# Patient Record
Sex: Female | Born: 1975
Health system: Southern US, Community
[De-identification: ages and names within clinical notes are randomized; demographics above are authoritative.]

## PROBLEM LIST (undated history)

## (undated) DIAGNOSIS — N979 Female infertility, unspecified: Secondary | ICD-10-CM

## (undated) DIAGNOSIS — F32A Depression, unspecified: Secondary | ICD-10-CM

## (undated) DIAGNOSIS — F419 Anxiety disorder, unspecified: Secondary | ICD-10-CM

## (undated) DIAGNOSIS — R32 Unspecified urinary incontinence: Secondary | ICD-10-CM

## (undated) DIAGNOSIS — E282 Polycystic ovarian syndrome: Secondary | ICD-10-CM

## (undated) DIAGNOSIS — G43009 Migraine without aura, not intractable, without status migrainosus: Secondary | ICD-10-CM

## (undated) DIAGNOSIS — Z8489 Family history of other specified conditions: Secondary | ICD-10-CM

## (undated) DIAGNOSIS — N809 Endometriosis, unspecified: Secondary | ICD-10-CM

## (undated) DIAGNOSIS — F329 Major depressive disorder, single episode, unspecified: Secondary | ICD-10-CM

## (undated) HISTORY — DX: Female infertility, unspecified: N97.9

## (undated) HISTORY — PX: LAPAROSCOPIC ENDOMETRIOSIS FULGURATION: SUR769

## (undated) HISTORY — DX: Unspecified urinary incontinence: R32

## (undated) HISTORY — PX: BREAST SURGERY: SHX581

## (undated) HISTORY — PX: HYSTEROSCOPY: SHX211

## (undated) HISTORY — DX: Endometriosis, unspecified: N80.9

## (undated) HISTORY — PX: PELVIC LAPAROSCOPY: SHX162

## (undated) HISTORY — PX: OTHER SURGICAL HISTORY: SHX169

## (undated) HISTORY — PX: CRYOTHERAPY: SHX1416

## (undated) HISTORY — DX: Polycystic ovarian syndrome: E28.2

## (undated) HISTORY — DX: Migraine without aura, not intractable, without status migrainosus: G43.009

---

## 1999-08-21 ENCOUNTER — Encounter (INDEPENDENT_AMBULATORY_CARE_PROVIDER_SITE_OTHER): Payer: Self-pay | Admitting: *Deleted

## 1999-08-21 ENCOUNTER — Ambulatory Visit (HOSPITAL_BASED_OUTPATIENT_CLINIC_OR_DEPARTMENT_OTHER): Admission: RE | Admit: 1999-08-21 | Discharge: 1999-08-21 | Payer: Self-pay | Admitting: Orthopedic Surgery

## 2000-08-10 ENCOUNTER — Other Ambulatory Visit: Admission: RE | Admit: 2000-08-10 | Discharge: 2000-08-10 | Payer: Self-pay | Admitting: Obstetrics and Gynecology

## 2001-10-05 ENCOUNTER — Other Ambulatory Visit: Admission: RE | Admit: 2001-10-05 | Discharge: 2001-10-05 | Payer: Self-pay | Admitting: Obstetrics and Gynecology

## 2001-10-18 ENCOUNTER — Ambulatory Visit (HOSPITAL_COMMUNITY): Admission: RE | Admit: 2001-10-18 | Discharge: 2001-10-18 | Payer: Self-pay | Admitting: Obstetrics and Gynecology

## 2001-10-18 ENCOUNTER — Encounter: Payer: Self-pay | Admitting: Obstetrics and Gynecology

## 2002-01-18 ENCOUNTER — Ambulatory Visit (HOSPITAL_COMMUNITY): Admission: RE | Admit: 2002-01-18 | Discharge: 2002-01-18 | Payer: Self-pay | Admitting: Obstetrics and Gynecology

## 2002-02-07 ENCOUNTER — Encounter: Admission: RE | Admit: 2002-02-07 | Discharge: 2002-05-08 | Payer: Self-pay | Admitting: Obstetrics and Gynecology

## 2003-03-19 ENCOUNTER — Inpatient Hospital Stay (HOSPITAL_COMMUNITY): Admission: AD | Admit: 2003-03-19 | Discharge: 2003-03-19 | Payer: Self-pay | Admitting: Obstetrics and Gynecology

## 2003-03-19 ENCOUNTER — Other Ambulatory Visit: Admission: RE | Admit: 2003-03-19 | Discharge: 2003-03-19 | Payer: Self-pay | Admitting: Obstetrics and Gynecology

## 2004-06-03 ENCOUNTER — Other Ambulatory Visit: Admission: RE | Admit: 2004-06-03 | Discharge: 2004-06-03 | Payer: Self-pay | Admitting: Obstetrics and Gynecology

## 2005-06-15 ENCOUNTER — Other Ambulatory Visit: Admission: RE | Admit: 2005-06-15 | Discharge: 2005-06-15 | Payer: Self-pay | Admitting: Obstetrics and Gynecology

## 2006-04-05 ENCOUNTER — Ambulatory Visit: Payer: Self-pay | Admitting: Internal Medicine

## 2006-06-21 ENCOUNTER — Other Ambulatory Visit: Admission: RE | Admit: 2006-06-21 | Discharge: 2006-06-21 | Payer: Self-pay | Admitting: Obstetrics and Gynecology

## 2007-06-28 ENCOUNTER — Inpatient Hospital Stay (HOSPITAL_COMMUNITY): Admission: AD | Admit: 2007-06-28 | Discharge: 2007-06-30 | Payer: Self-pay | Admitting: Obstetrics and Gynecology

## 2007-07-03 ENCOUNTER — Encounter: Admission: RE | Admit: 2007-07-03 | Discharge: 2007-08-01 | Payer: Self-pay | Admitting: Obstetrics and Gynecology

## 2007-08-02 ENCOUNTER — Encounter: Admission: RE | Admit: 2007-08-02 | Discharge: 2007-09-01 | Payer: Self-pay | Admitting: Obstetrics and Gynecology

## 2007-09-02 ENCOUNTER — Encounter: Admission: RE | Admit: 2007-09-02 | Discharge: 2007-09-24 | Payer: Self-pay | Admitting: Obstetrics and Gynecology

## 2008-11-26 ENCOUNTER — Ambulatory Visit: Payer: Self-pay | Admitting: Obstetrics and Gynecology

## 2008-11-26 ENCOUNTER — Encounter: Payer: Self-pay | Admitting: Obstetrics and Gynecology

## 2008-11-26 ENCOUNTER — Other Ambulatory Visit: Admission: RE | Admit: 2008-11-26 | Discharge: 2008-11-26 | Payer: Self-pay | Admitting: Obstetrics and Gynecology

## 2008-12-02 ENCOUNTER — Ambulatory Visit: Payer: Self-pay | Admitting: Obstetrics and Gynecology

## 2009-12-10 ENCOUNTER — Ambulatory Visit: Payer: Self-pay | Admitting: Obstetrics and Gynecology

## 2009-12-10 ENCOUNTER — Other Ambulatory Visit: Admission: RE | Admit: 2009-12-10 | Discharge: 2009-12-10 | Payer: Self-pay | Admitting: Obstetrics and Gynecology

## 2009-12-24 ENCOUNTER — Ambulatory Visit: Payer: Self-pay | Admitting: Obstetrics and Gynecology

## 2010-05-06 ENCOUNTER — Ambulatory Visit: Payer: Self-pay | Admitting: Obstetrics and Gynecology

## 2010-05-08 ENCOUNTER — Ambulatory Visit: Payer: Self-pay | Admitting: Obstetrics and Gynecology

## 2010-10-11 NOTE — L&D Delivery Note (Signed)
Delivery Note At 7:55 PM a viable female was delivered via Vaginal, Spontaneous Delivery (Presentation: ; Occiput Anterior).  APGAR: 9, 9; weight 7 lb 8.6 oz (3419 g).   Placenta status: Intact, Spontaneous.  Cord: 3 vessels with no complications. Cord pH: not needed.   Anesthesia: Pudendal block Episiotomy: None Lacerations: Vaginal; Perineal;1st degree and right labial. Suture Repair: 3.0 vicryl rapide Est. Blood Loss (mL):  250 cc.  Mom to postpartum.  Baby to with mother.Marland Kitchen  Virdia Ziesmer R 08/13/2011, 9:18 PM

## 2010-12-09 ENCOUNTER — Ambulatory Visit (INDEPENDENT_AMBULATORY_CARE_PROVIDER_SITE_OTHER): Payer: BC Managed Care – PPO | Admitting: Obstetrics and Gynecology

## 2010-12-09 DIAGNOSIS — M545 Low back pain, unspecified: Secondary | ICD-10-CM

## 2010-12-09 DIAGNOSIS — N912 Amenorrhea, unspecified: Secondary | ICD-10-CM

## 2010-12-11 ENCOUNTER — Other Ambulatory Visit (INDEPENDENT_AMBULATORY_CARE_PROVIDER_SITE_OTHER): Payer: BC Managed Care – PPO

## 2010-12-11 ENCOUNTER — Other Ambulatory Visit: Payer: BC Managed Care – PPO

## 2010-12-11 DIAGNOSIS — N912 Amenorrhea, unspecified: Secondary | ICD-10-CM

## 2010-12-15 ENCOUNTER — Other Ambulatory Visit (INDEPENDENT_AMBULATORY_CARE_PROVIDER_SITE_OTHER): Payer: BC Managed Care – PPO

## 2010-12-15 DIAGNOSIS — N912 Amenorrhea, unspecified: Secondary | ICD-10-CM

## 2010-12-17 ENCOUNTER — Other Ambulatory Visit (HOSPITAL_COMMUNITY)
Admission: RE | Admit: 2010-12-17 | Discharge: 2010-12-17 | Disposition: A | Discharge status: Home / Self Care | Payer: BC Managed Care – PPO | Source: Ambulatory Visit | Attending: Obstetrics and Gynecology | Admitting: Obstetrics and Gynecology

## 2010-12-17 ENCOUNTER — Other Ambulatory Visit: Payer: Self-pay | Admitting: Obstetrics and Gynecology

## 2010-12-17 ENCOUNTER — Encounter (INDEPENDENT_AMBULATORY_CARE_PROVIDER_SITE_OTHER): Payer: BC Managed Care – PPO | Admitting: Obstetrics and Gynecology

## 2010-12-17 DIAGNOSIS — Z01419 Encounter for gynecological examination (general) (routine) without abnormal findings: Secondary | ICD-10-CM

## 2010-12-17 DIAGNOSIS — Z124 Encounter for screening for malignant neoplasm of cervix: Secondary | ICD-10-CM | POA: Insufficient documentation

## 2010-12-29 ENCOUNTER — Ambulatory Visit (INDEPENDENT_AMBULATORY_CARE_PROVIDER_SITE_OTHER): Payer: BC Managed Care – PPO | Admitting: Obstetrics and Gynecology

## 2010-12-29 ENCOUNTER — Other Ambulatory Visit: Payer: BC Managed Care – PPO

## 2010-12-29 DIAGNOSIS — N921 Excessive and frequent menstruation with irregular cycle: Secondary | ICD-10-CM

## 2010-12-29 DIAGNOSIS — O9989 Other specified diseases and conditions complicating pregnancy, childbirth and the puerperium: Secondary | ICD-10-CM

## 2010-12-30 LAB — ABO/RH: RH Type: POSITIVE

## 2010-12-30 LAB — ANTIBODY SCREEN: Antibody Screen: NEGATIVE

## 2011-01-07 LAB — GC/CHLAMYDIA PROBE AMP, GENITAL: Gonorrhea: NEGATIVE

## 2011-02-23 NOTE — H&P (Signed)
Erika Farley, Erika Farley             ACCOUNT NO.:  [id number]   MEDICAL RECORD NO.:  [id number]          PATIENT TYPE:  INP   LOCATION:  9166                          FACILITY:  [facility]   PHYSICIAN:  Lenoard Aden, M.D.DATE OF BIRTH:  [birth year]   DATE OF ADMISSION:  06/28/2007  DATE OF DISCHARGE:                              HISTORY & PHYSICAL   INDICATION FOR INDUCTION:  Intractable/worsening depressive anxiety  symptomatology for controlled attempt at vaginal delivery and  optimization of depressive symptomatology.   HISTORY OF PRESENT ILLNESS:  She is a 35 year old white female G1, P0  who presents for induction to promote, at 39 weeks, for cervical  ripening and induction for worsening depressive anxiety symptoms and the  hope of delivery with subsequent better management of her anxiety and  depressive symptomatology.   MEDICATIONS:  Prenatal vitamins,  BuSpar and Zoloft.   PAST MEDICAL HISTORY:  1. Bone graft surgery.  2. Anemia.  3. UTI.  4. Pyelonephritis.  5. PCO.  6. Depression and anxiety.   SOCIAL HISTORY:  She is a nonsmoker, nondrinker, denies domestic or  physical violence.   PREGNANCY COURSE TO DATE:  Uncomplicated.   PHYSICAL EXAMINATION:  GENERAL APPEARANCE:  She is an anxious-appearing  white female in no acute distress.  HEENT:  Normal.  LUNGS:  Clear.  CARDIOVASCULAR:  Regular rate and rhythm.  ABDOMEN:  Soft, gravid, nontender.  Estimated fetal weight 7-1/2 to 8  pounds.  PELVIC:  Cervix is closed, 50%, vertex -1.  EXTREMITIES:  No cords.  NEUROLOGIC:  Nonfocal.  SKIN:  Intact.   NST is reactive.  Cervidil is placed.   IMPRESSION:  1. Term intrauterine pregnancy at 39 weeks for cervical ripening and      induction.  2. Worsening depressive anxiety symptoms for optimization of therapy      postpartum.   PLAN:  Proceed with cervical ripening and induction.      Lenoard Aden, M.D.  Electronically Signed     RJT/MEDQ  D:   06/28/2007  T:  06/28/2007  Job:  [id number]

## 2011-02-26 NOTE — Op Note (Signed)
[facility]  Patient:    Erika Farley, Erika Farley Visit Number: [id number] MRN: [id number]          Service Type: DSU Location: [facility] Attending Physician:  Sharon Mt Dictated by:   Rande Brunt. Eda Paschal, M.D. Proc. Date: 01/18/02 Admit Date:  01/18/2002                             Operative Report  PREOPERATIVE DIAGNOSES:       Probable symptomatic endometriosis.  POSTOPERATIVE DIAGNOSES:      Symptomatic endometriosis, polycystic ovarian disease.  NAME OF OPERATION:            Diagnostic laparoscopy with laser of both paraovarian cysts and endometriosis.  SURGEON:                      Daniel L. Eda Paschal, M.D.  ANESTHESIA:                   General endotracheal.  INDICATIONS:                  Patient is a 35 year old nulligravida who has been having progressively increasing dysmenorrhea with back discomfort which starts four to five days before her period.  She is not getting relief from nonsteroidal anti-inflammatory drugs.  She has been trying to conceive and therefore does not want to go on birth control pills.  She is having significant dyspareunia as well.  On pelvic examination she has a retroverted uterus with very tender uterosacral ligaments.  She enters the hospital for laparoscopy with endometriosis suspected.  FINDINGS:                     At the time of laparoscopy patient had significant cul-de-sac endometriosis mostly involving the serosa of the uterus posteriorly.  It was mostly nonpigmented.  She had several areas that were pigmented.  She also had some red areas of endometriosis.  She had a red area of endometriosis under her right fallopian tube.  She also had some red areas of endometriosis on the right side behind the ovary.  Patient had an endometrial implant on the right ovary about 2-3 mm that was pigmented.  She had some surface adhesions on the left ovary that appeared to be consistent with endometriosis.  Both  ovaries were polycystic.  Fallopian tubes were normal with luxuriant fimbria.  There was no adhesive disease.  Uterus itself was normal except as described above.  Ileocecal junction was identified. Appendix was normal as was the right upper quadrant.  PROCEDURE:                    After adequate general endotracheal anesthesia the patient was placed in the dorsal lithotomy position, prepped and draped in the usual sterile manner.  A Jelco cannula was inserted into the uterus. The bladder was emptied with a Robinson catheter.  A pneumoperitoneum was created with a Veress needle placed subumbilically with carbon dioxide. Carbon dioxide 3.5 L was utilized.  The subumbilical incision was then  extended.  A 10 mm trocar was placed and through that the operating laparoscope was placed.  A 5 mm port was placed suprapubically and through that an irrigator aspirator as well as bipolar coagulator were placed at different times during the procedure.  The pelvis was visualized and was noted above.  A neodymium YAG laser was utilized with a GR-4  tip over the bare fiber.  12 watts power was used throughout.  All areas of endometriosis were laser vaporized.  There was one area that bled significantly and bipolar was utilized to stop it.  Multiple cystic areas on both ovaries were lasered and drained to fluid, mostly to be sure that we were not dealing with endometriomas.  At the termination of the procedure there was no bleeding noted.  The trocars were removed.  The pneumoperitoneum was evacuated.  The subumbilical incision was closed with 0 Vicryl and the skin incisions were closed with Steri-Strips.  The patients cervix tore from the tenaculum and it was reapproximated with a figure-of-eight of 0 Vicryl.  The patient tolerated procedure well and left the operating room in satisfactory condition. Dictated by:   Rande Brunt. Eda Paschal, M.D. Attending Physician:  Sharon Mt DD:  01/18/02 TD:   01/18/02 Job: [id number]

## 2011-07-22 LAB — CBC
HCT: 30 — ABNORMAL LOW
HCT: 37.6
Hemoglobin: 10.4 — ABNORMAL LOW
Hemoglobin: 13
MCHC: 34.5
MCHC: 34.8
MCV: 83.9
MCV: 84.4
Platelets: 193
Platelets: 218
RBC: 3.55 — ABNORMAL LOW
RBC: 4.48
RDW: 13.7
RDW: 13.7
WBC: 11.5 — ABNORMAL HIGH
WBC: 18.7 — ABNORMAL HIGH

## 2011-07-22 LAB — RPR: RPR Ser Ql: NONREACTIVE

## 2011-08-13 ENCOUNTER — Encounter (HOSPITAL_COMMUNITY): Payer: Self-pay | Admitting: *Deleted

## 2011-08-13 ENCOUNTER — Inpatient Hospital Stay (HOSPITAL_COMMUNITY)
Admission: AD | Admit: 2011-08-13 | Discharge: 2011-08-14 | DRG: 373 | Disposition: A | Discharge status: Home / Self Care | Payer: BC Managed Care – PPO | Source: Ambulatory Visit | Attending: Obstetrics & Gynecology | Admitting: Obstetrics & Gynecology

## 2011-08-13 DIAGNOSIS — O09529 Supervision of elderly multigravida, unspecified trimester: Secondary | ICD-10-CM | POA: Diagnosis present

## 2011-08-13 HISTORY — DX: Depression, unspecified: F32.A

## 2011-08-13 HISTORY — DX: Major depressive disorder, single episode, unspecified: F32.9

## 2011-08-13 LAB — RUBELLA ANTIBODY, IGM: Rubella: IMMUNE

## 2011-08-13 LAB — RPR: RPR: NONREACTIVE

## 2011-08-13 LAB — HIV ANTIBODY (ROUTINE TESTING W REFLEX): HIV: NONREACTIVE

## 2011-08-13 LAB — STREP B DNA PROBE: GBS: NEGATIVE

## 2011-08-13 MED ORDER — ZOLPIDEM TARTRATE 5 MG PO TABS: 5.0000 mg | ORAL_TABLET | Freq: Every evening | ORAL | Status: DC | PRN

## 2011-08-13 MED ORDER — NALOXONE HCL 0.4 MG/ML IJ SOLN
INTRAMUSCULAR | Status: AC
  Filled 2011-08-13: qty 1

## 2011-08-13 MED ORDER — WITCH HAZEL-GLYCERIN EX PADS: 1.0000 "application " | MEDICATED_PAD | CUTANEOUS | Status: DC | PRN

## 2011-08-13 MED ORDER — LACTATED RINGERS IV SOLN: 500.0000 mL | INTRAVENOUS | Status: DC | PRN

## 2011-08-13 MED ORDER — CITRIC ACID-SODIUM CITRATE 334-500 MG/5ML PO SOLN: 30.0000 mL | ORAL | Status: DC | PRN

## 2011-08-13 MED ORDER — LIDOCAINE HCL (PF) 1 % IJ SOLN
30.0000 mL | INTRAMUSCULAR | Status: DC | PRN
  Administered 2011-08-13: 60 mL via SUBCUTANEOUS
  Filled 2011-08-13: qty 60

## 2011-08-13 MED ORDER — DIPHENHYDRAMINE HCL 25 MG PO CAPS: 25.0000 mg | ORAL_CAPSULE | Freq: Four times a day (QID) | ORAL | Status: DC | PRN

## 2011-08-13 MED ORDER — PRENATAL PLUS 27-1 MG PO TABS
1.0000 | ORAL_TABLET | Freq: Every day | ORAL | Status: DC
  Filled 2011-08-13: qty 1

## 2011-08-13 MED ORDER — LANOLIN HYDROUS EX OINT: TOPICAL_OINTMENT | CUTANEOUS | Status: DC | PRN

## 2011-08-13 MED ORDER — SENNOSIDES-DOCUSATE SODIUM 8.6-50 MG PO TABS
2.0000 | ORAL_TABLET | Freq: Every day | ORAL | Status: DC
  Administered 2011-08-13: 2 via ORAL

## 2011-08-13 MED ORDER — IBUPROFEN 600 MG PO TABS
600.0000 mg | ORAL_TABLET | Freq: Four times a day (QID) | ORAL | Status: DC | PRN
  Administered 2011-08-13: 600 mg via ORAL
  Filled 2011-08-13: qty 1

## 2011-08-13 MED ORDER — IBUPROFEN 600 MG PO TABS
600.0000 mg | ORAL_TABLET | Freq: Four times a day (QID) | ORAL | Status: DC
  Administered 2011-08-14 (×2): 600 mg via ORAL
  Filled 2011-08-13 (×2): qty 1

## 2011-08-13 MED ORDER — LACTATED RINGERS IV SOLN: INTRAVENOUS | Status: DC

## 2011-08-13 MED ORDER — ACETAMINOPHEN 325 MG PO TABS: 650.0000 mg | ORAL_TABLET | ORAL | Status: DC | PRN

## 2011-08-13 MED ORDER — TETANUS-DIPHTH-ACELL PERTUSSIS 5-2.5-18.5 LF-MCG/0.5 IM SUSP
0.5000 mL | Freq: Once | INTRAMUSCULAR | Status: AC
  Administered 2011-08-14: 0.5 mL via INTRAMUSCULAR
  Filled 2011-08-13: qty 0.5

## 2011-08-13 MED ORDER — SIMETHICONE 80 MG PO CHEW: 80.0000 mg | CHEWABLE_TABLET | ORAL | Status: DC | PRN

## 2011-08-13 MED ORDER — OXYCODONE-ACETAMINOPHEN 5-325 MG PO TABS: 1.0000 | ORAL_TABLET | ORAL | Status: DC | PRN

## 2011-08-13 MED ORDER — OXYTOCIN 20 UNITS IN LACTATED RINGERS INFUSION - SIMPLE: 125.0000 mL/h | Freq: Once | INTRAVENOUS | Status: DC

## 2011-08-13 MED ORDER — OXYTOCIN 10 UNIT/ML IJ SOLN
INTRAMUSCULAR | Status: AC
  Administered 2011-08-13: 20 [IU] via INTRAMUSCULAR
  Filled 2011-08-13: qty 2

## 2011-08-13 MED ORDER — BENZOCAINE-MENTHOL 20-0.5 % EX AERO
INHALATION_SPRAY | CUTANEOUS | Status: AC
  Administered 2011-08-13: 1 via TOPICAL
  Filled 2011-08-13: qty 56

## 2011-08-13 MED ORDER — FLEET ENEMA 7-19 GM/118ML RE ENEM: 1.0000 | ENEMA | RECTAL | Status: DC | PRN

## 2011-08-13 MED ORDER — DIBUCAINE 1 % RE OINT: 1.0000 "application " | TOPICAL_OINTMENT | RECTAL | Status: DC | PRN

## 2011-08-13 MED ORDER — ONDANSETRON HCL 4 MG/2ML IJ SOLN: 4.0000 mg | Freq: Four times a day (QID) | INTRAMUSCULAR | Status: DC | PRN

## 2011-08-13 MED ORDER — OXYTOCIN BOLUS FROM INFUSION
500.0000 mL | Freq: Once | INTRAVENOUS | Status: DC
  Filled 2011-08-13: qty 500

## 2011-08-13 MED ORDER — BENZOCAINE-MENTHOL 20-0.5 % EX AERO
1.0000 "application " | INHALATION_SPRAY | CUTANEOUS | Status: DC | PRN
  Administered 2011-08-13: 1 via TOPICAL

## 2011-08-13 MED ORDER — OXYCODONE-ACETAMINOPHEN 5-325 MG PO TABS: 2.0000 | ORAL_TABLET | ORAL | Status: DC | PRN

## 2011-08-13 NOTE — H&P (Signed)
Erika Farley is a 35 y.o. female presenting for active labor at 39 wks. Bleeding+, no leaking. Good FMs. Uncomplicated Ob sourse, [facility]. (Dr Algie Coffer)  History OB History    Grav Para Term Preterm Abortions TAB SAB Ect Mult Living   2 1 1  0 0 0 0 0 0 1     Past Medical History  Diagnosis Date  . No pertinent past medical history    Past Surgical History  Procedure Date  . Bone tumor in finger   . Laparoscopic endometriosis fulguration    Family History: family history is not on file. Social History:  reports that she has never smoked. She has never used smokeless tobacco. She reports that she does not drink alcohol or use illicit drugs.  ROS No HAs/CP/vision prb/ all neg.  Physical exam:  A&O x 3, no acute distress. Pleasant, in active labor HEENT neg, no thyromegaly Lungs CTA bilat CV RRR, S1S2 normal Abdo soft, non tender, non acute Extr no edema/ tenderness Pelvic 9/100/0/vtx. SROM clear. Bloody show.   FHT  140-150s  Intermittent only, was category I in triage.  Toco spont, q 2-3 min .Dilation: 7 Effacement (%): 100 Station: -1 Exam by:: Dorrene German RN Blood pressure 135/75, pulse 83, resp. rate 24, height 5\' 3"  (1.6 m), weight 81.647 kg (180 lb). Exam Physical Exam  Prenatal labs: ABO, Rh: O/Positive/-- (03/21 0000) Antibody: Negative (03/21 0000) Rubella:  Immu RPR: Nonreactive (08/13 0000)  HBsAg:   Neg HIV:   Neg GBS: Negative (10/11 0000)  Nl Glucola screen Nl Anatomy sono.   Assessment/Plan: Active labor, 39 wks, no pain mngmt desired. FHT -intermittent.    Kathreen Dileo R 08/13/2011, 7:24 PM

## 2011-08-14 LAB — CBC
MCHC: 33.3 g/dL (ref 30.0–36.0)
Platelets: 175 10*3/uL (ref 150–400)
RDW: 13 % (ref 11.5–15.5)
WBC: 16.8 10*3/uL — ABNORMAL HIGH (ref 4.0–10.5)

## 2011-08-14 MED ORDER — IBUPROFEN 200 MG PO TABS: 200.0000 mg | ORAL_TABLET | ORAL | Status: AC | PRN

## 2011-08-14 MED ORDER — IBUPROFEN 400 MG PO TABS
200.0000 mg | ORAL_TABLET | ORAL | Status: DC | PRN
  Filled 2011-08-14: qty 1

## 2011-08-14 MED ORDER — ACETAMINOPHEN 325 MG PO TABS: 650.0000 mg | ORAL_TABLET | Freq: Four times a day (QID) | ORAL | Status: AC | PRN

## 2011-08-14 MED ORDER — ACETAMINOPHEN 325 MG PO TABS
650.0000 mg | ORAL_TABLET | Freq: Four times a day (QID) | ORAL | Status: DC | PRN
  Administered 2011-08-14: 650 mg via ORAL
  Filled 2011-08-14: qty 2

## 2011-08-14 NOTE — Progress Notes (Signed)
CLINICAL SOCIAL WORK  BRIEF PSYCHOSOCIAL ASSESSMENT  Referred by  Central Nursery on 08/13/11  for Hx of depression  XPatient Interview Erika Farley Interview    PSYCHOSOCIAL DATA:    Family's new baby girl lives with [name], FOB and two brothers.    Primary Support (Name/Relationship) : Erika Farley-husband Degree of support available: Consistent, excellent  CURRENT CONCERNS:      X None noted    SOCIAL WORK ASSESSMENT/PLAN:  LCSW met with [name], FOB and newborn daughter at bedside for assessment of strengths and needs following referral from care provider for history of depression.  [name] reports that she stopped taking depression medications when she found out she was pregnant.  Her behavioral health needs are managed at her primary care doctor's office as needed.  [name] is breastfeeding her newborn. She does not intend to utilize medications during breastfeeding.  She did not use medications when breastfeeding her other children as well.  Mom plans to return to her OB for her postpartum check up.  She understands her OB office can also be a source of support should behavioral health needs arise.  [name] and FOB desire early discharge to enjoy the comforts of home.  [name] does not report current symptoms of depression and did not need additional support or resources at this time.  She reported that she felt fine.  Parents are happy for baby's arrival and look forward to going home and being with family. High level of support available to [name] and family.    X  No Further Intervention Required   PATIENT'S/FAMILY'S RESPONSE TO PLAN OF CARE:  Parents were accepting of LCSW visit.  They are eager to go home, rest, and enjoy time with their other two children and family.  Parents knowledgeable of resources and supports available to manage behavioral health needs should they be indicated.    Erika Acosta, LCSW  08/14/2011  1:51 pm

## 2011-08-14 NOTE — Discharge Summary (Signed)
Obstetric Discharge Summary Reason for Admission: onset of labor Prenatal Procedures: none Intrapartum Procedures: NSVD and pudendal block Postpartum Procedures: none Complications-Operative and Postpartum: 1st  degree perineal laceration Hemoglobin  Date Value Range Status  08/14/2011 12.9  12.0-15.0 (g/dL) Final     HCT  Date Value Range Status  08/14/2011 38.7  36.0-46.0 (%) Final    Discharge Diagnoses: Term Pregnancy-delivered  Discharge Information: Date: 08/14/2011 Activity: pelvic rest Diet: routine Medications: Ibuprofen Condition: stable Instructions: refer to practice specific booklet Discharge to: home   Newborn Data: Live born female  Birth Weight: 7 lb 8.6 oz (3419 g) APGAR: 9, 9  Home with mother.  Juanetta Beets SCNM BAILEY,TANYA 08/14/2011, 11:35 AM  Chart reviewed, desires early discharge since doing well. Agree if baby ok for discharge/

## 2011-08-14 NOTE — Progress Notes (Signed)
  PPD 1 SVD  S:  Reports feeling well, desires d/c this evening             Tolerating po/ No vomiting; slight nausea after taking 600 mg ibuprofen             Bleeding is light             Pain controlled withibuprofen (OTC)             Up ad lib / ambulatory  Newborn breast feeding    O:  A & O x 3, NAD, pleasant affect             VS: Blood pressure 123/75, pulse 77, temperature 98.2 F (36.8 C), temperature source Oral, resp. rate 18, height 5\' 3"  (1.6 m), weight 81.647 kg (180 lb), last menstrual period 11/12/2010, SpO2 98.00%, unknown if currently breastfeeding.  LABS: Lab Results  Component Value Date   WBC 16.8* 08/14/2011   HGB 12.9 08/14/2011   HCT 38.7 08/14/2011   MCV 86.8 08/14/2011   PLT 175 08/14/2011     Lungs: Clear and unlabored  Heart: regular rate and rhythm / no mumurs  Abdomen: soft, non-tender, non-distended              Fundus: firm, non-tender, U-1  Perineum: healing well, no edema, no ecchymosis  Lochia: scant rubra  Extremities: no edema, no calf pain or tenderness, negative Homans    A: PPD # 1  Doing well - stable status  Breastfeeding independently  P: Routine post partum orders  Decrease Ibuprofen to 200mg  PRN  DC home this PM (24hrs) Rosita Fire [facility] 08/14/2011, 10:51 AM

## 2014-08-12 ENCOUNTER — Encounter (HOSPITAL_COMMUNITY): Payer: Self-pay | Admitting: *Deleted

## 2015-03-18 ENCOUNTER — Other Ambulatory Visit: Payer: Self-pay | Admitting: Gynecology

## 2015-03-19 LAB — CYTOLOGY - PAP

## 2015-05-06 ENCOUNTER — Other Ambulatory Visit: Payer: Self-pay | Admitting: Gynecology

## 2015-05-07 LAB — CYTOLOGY - PAP

## 2016-03-15 ENCOUNTER — Other Ambulatory Visit: Payer: Self-pay | Admitting: Physician Assistant

## 2016-03-15 DIAGNOSIS — R109 Unspecified abdominal pain: Secondary | ICD-10-CM

## 2016-03-15 DIAGNOSIS — R319 Hematuria, unspecified: Secondary | ICD-10-CM

## 2016-03-16 ENCOUNTER — Ambulatory Visit
Admission: RE | Admit: 2016-03-16 | Discharge: 2016-03-16 | Disposition: A | Discharge status: Home / Self Care | Payer: BLUE CROSS/BLUE SHIELD | Source: Ambulatory Visit | Attending: Physician Assistant | Admitting: Physician Assistant

## 2016-03-16 DIAGNOSIS — R319 Hematuria, unspecified: Secondary | ICD-10-CM

## 2016-03-16 DIAGNOSIS — R109 Unspecified abdominal pain: Secondary | ICD-10-CM

## 2016-03-16 IMAGING — CT CT ABD-PELV W/O CM
1 of 2 series · 15 of 32 positions shown, 19 images · non-contrast
Comparison: None.

CLINICAL DATA: Hematuria syndrome. Bilateral flank pain
intermittently 1 year.

EXAM:
CT ABDOMEN AND PELVIS WITHOUT CONTRAST
TECHNIQUE: Multidetector CT imaging of the abdomen and pelvis was performed
following the standard protocol without IV contrast.

[Series 2: renal standard/full · axial · 0.74mm/px · z∈[-459,-19]mm · 15 of 96 slices shown, 19 images]
[im 4/96  soft-tissue]
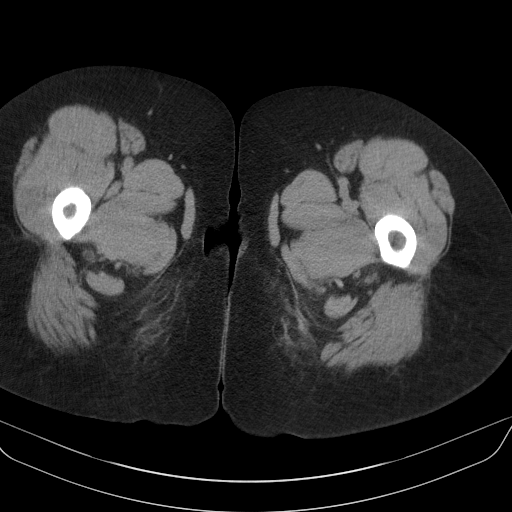
[im 4/96  bone]
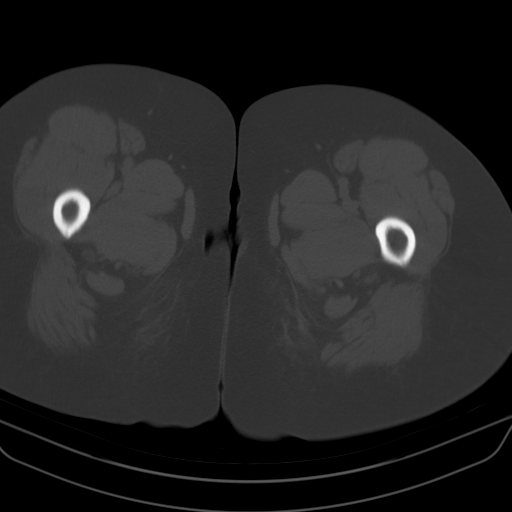
[im 12/96  soft-tissue]
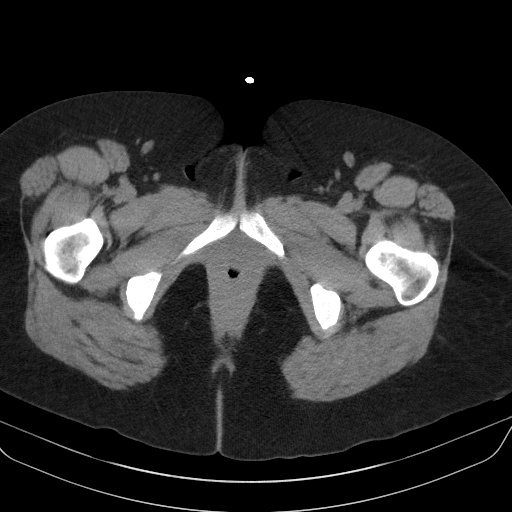
[im 20/96  soft-tissue]
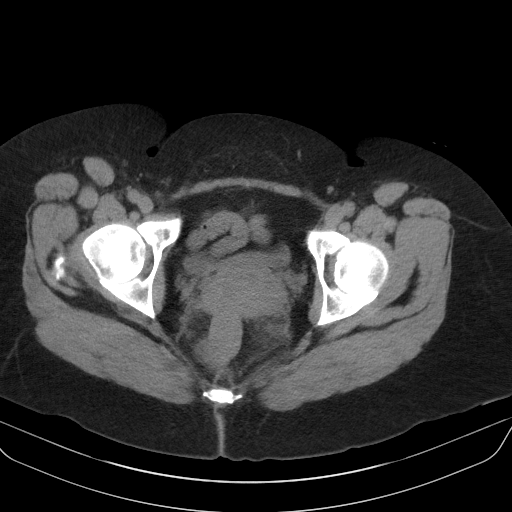
[im 27/96  soft-tissue]
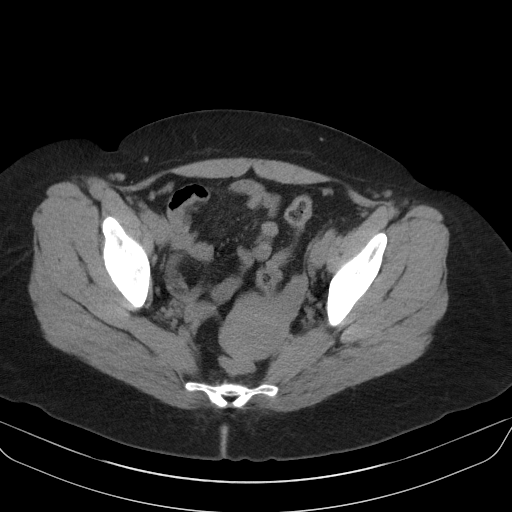
[im 35/96  soft-tissue]
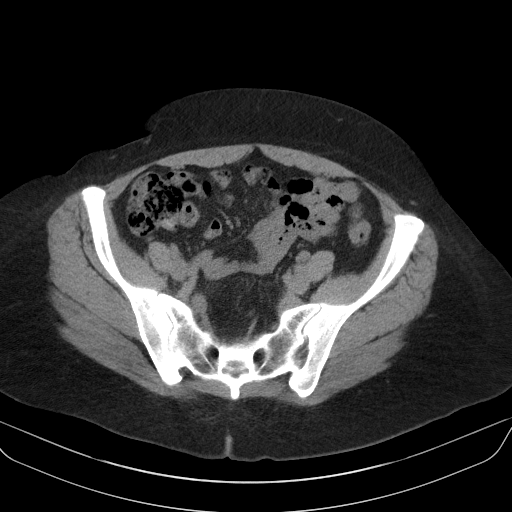
[im 42/96  soft-tissue]
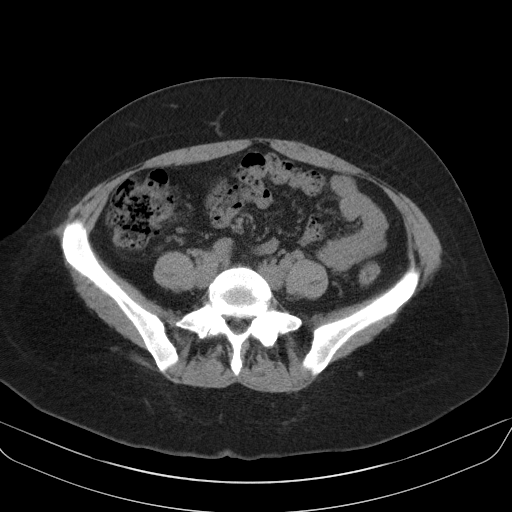
[im 50/96  soft-tissue]
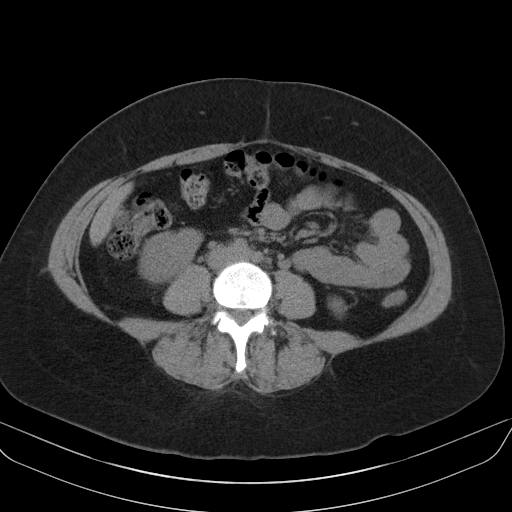
[im 54/96  soft-tissue]
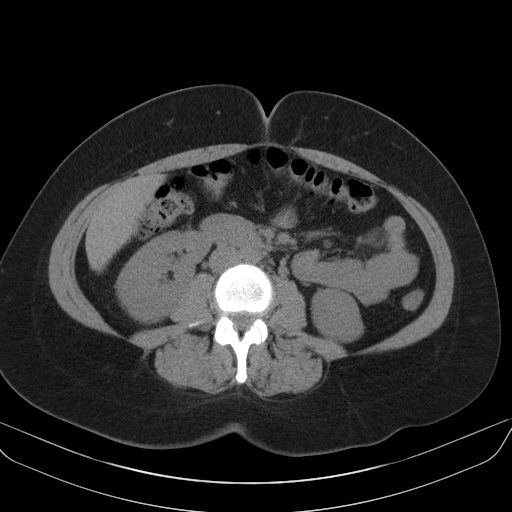
[im 61/96  soft-tissue]
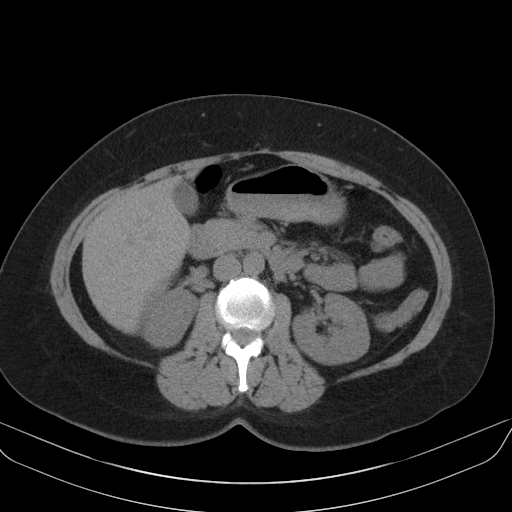
[im 61/96  bone]
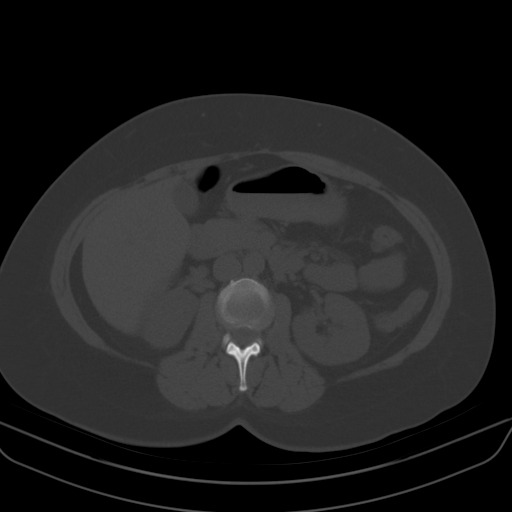
[im 69/96  soft-tissue]
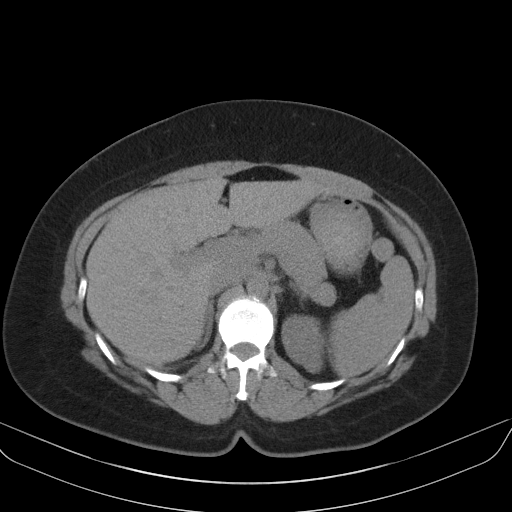
[im 77/96  soft-tissue]
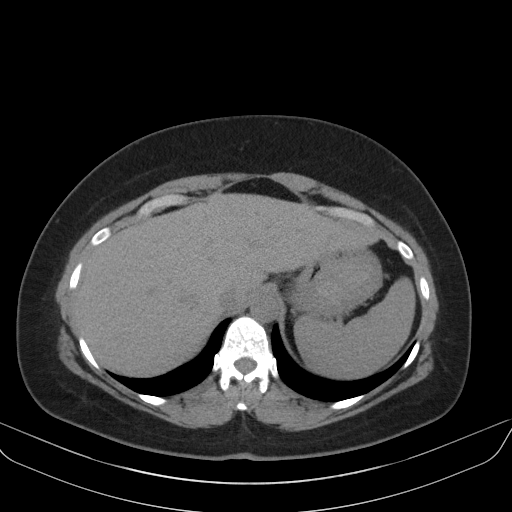
[im 80/96  lung]
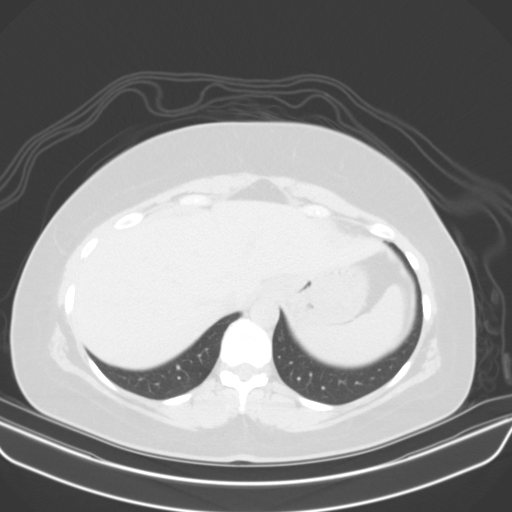
[im 84/96  soft-tissue]
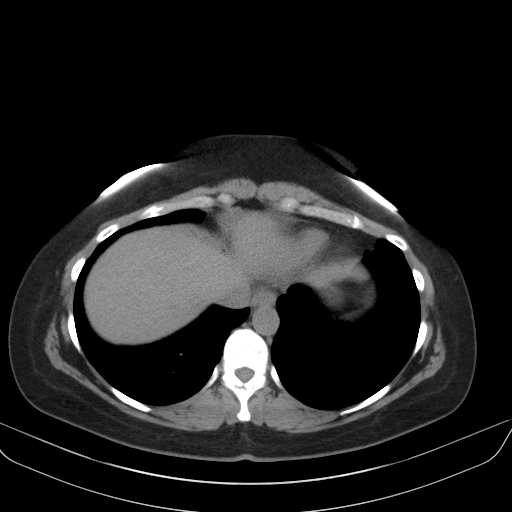
[im 84/96  lung]
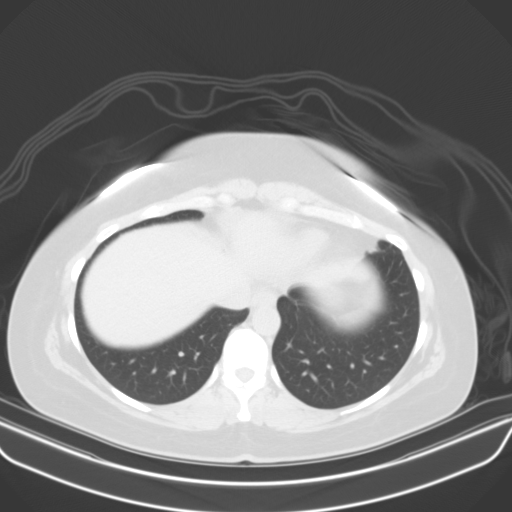
[im 88/96  lung]
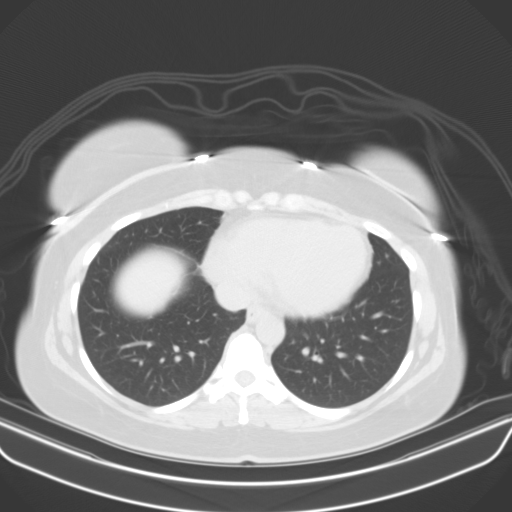
[im 92/96  soft-tissue]
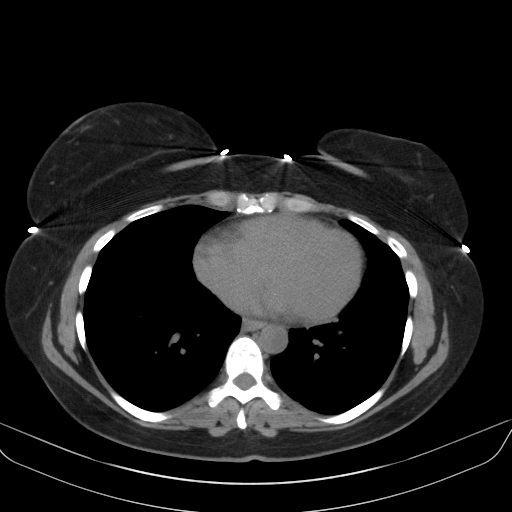
[im 92/96  lung]
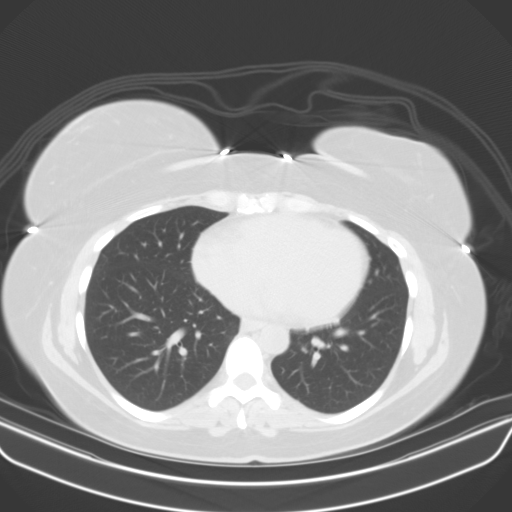

[15 of 32 positions shown; findings below may reference images not displayed]

FINDINGS: Lung bases demonstrate 2 small 2 mm nodules over the left base.

Abdominal images demonstrate the liver, spleen, pancreas,
gallbladder and adrenal glands to be within normal.

Kidneys are normal in size without hydronephrosis or
nephrolithiasis. Ureters are within normal without evidence of
stones.

Vascular structures are unremarkable.

Stomach is within normal. The appendix is normal. Small bowel is
normal. There is minimal diverticulosis of the colon.

Mesentery is within normal without inflammation or free fluid.

Pelvic images demonstrate the uterus, ovaries, bladder and rectum to
be within normal. No free fluid. Remaining bones and soft tissues
are within normal.
IMPRESSION: No acute findings in the abdomen/pelvis.

Subtle diverticulosis of the colon.

## 2016-10-21 ENCOUNTER — Ambulatory Visit (INDEPENDENT_AMBULATORY_CARE_PROVIDER_SITE_OTHER): Payer: BLUE CROSS/BLUE SHIELD | Admitting: Obstetrics and Gynecology

## 2016-10-21 ENCOUNTER — Encounter: Payer: Self-pay | Admitting: Obstetrics and Gynecology

## 2016-10-21 VITALS — BP 110/70 | HR 76 | Resp 13 | Ht 63.5 in | Wt 191.0 lb

## 2016-10-21 DIAGNOSIS — Z0000 Encounter for general adult medical examination without abnormal findings: Secondary | ICD-10-CM | POA: Diagnosis not present

## 2016-10-21 DIAGNOSIS — R635 Abnormal weight gain: Secondary | ICD-10-CM | POA: Diagnosis not present

## 2016-10-21 DIAGNOSIS — Z8742 Personal history of other diseases of the female genital tract: Secondary | ICD-10-CM | POA: Diagnosis not present

## 2016-10-21 DIAGNOSIS — R5383 Other fatigue: Secondary | ICD-10-CM | POA: Diagnosis not present

## 2016-10-21 DIAGNOSIS — N946 Dysmenorrhea, unspecified: Secondary | ICD-10-CM

## 2016-10-21 DIAGNOSIS — Z01419 Encounter for gynecological examination (general) (routine) without abnormal findings: Secondary | ICD-10-CM

## 2016-10-21 DIAGNOSIS — Z124 Encounter for screening for malignant neoplasm of cervix: Secondary | ICD-10-CM

## 2016-10-21 LAB — LIPID PANEL
Cholesterol: 142 mg/dL (ref <200)
HDL: 43 mg/dL — ABNORMAL LOW (ref >50)
LDL CALC: 81 mg/dL (ref <100)
TRIGLYCERIDES: 89 mg/dL (ref <150)
Total CHOL/HDL Ratio: 3.3 Ratio (ref <5.0)
VLDL: 18 mg/dL (ref <30)

## 2016-10-21 LAB — COMPREHENSIVE METABOLIC PANEL
ALT: 14 U/L (ref 6–29)
AST: 19 U/L (ref 10–30)
Albumin: 4 g/dL (ref 3.6–5.1)
Alkaline Phosphatase: 60 U/L (ref 33–115)
BILIRUBIN TOTAL: 0.4 mg/dL (ref 0.2–1.2)
BUN: 14 mg/dL (ref 7–25)
CALCIUM: 9 mg/dL (ref 8.6–10.2)
CO2: 20 mmol/L (ref 20–31)
Chloride: 106 mmol/L (ref 98–110)
Creat: 0.71 mg/dL (ref 0.50–1.10)
GLUCOSE: 88 mg/dL (ref 65–99)
Potassium: 4.2 mmol/L (ref 3.5–5.3)
Sodium: 139 mmol/L (ref 135–146)
Total Protein: 7 g/dL (ref 6.1–8.1)

## 2016-10-21 LAB — CBC
HCT: 44 % (ref 35.0–45.0)
Hemoglobin: 14.4 g/dL (ref 11.7–15.5)
MCH: 27.9 pg (ref 27.0–33.0)
MCHC: 32.7 g/dL (ref 32.0–36.0)
MCV: 85.3 fL (ref 80.0–100.0)
MPV: 9.7 fL (ref 7.5–12.5)
PLATELETS: 239 10*3/uL (ref 140–400)
RBC: 5.16 MIL/uL — AB (ref 3.80–5.10)
RDW: 13.3 % (ref 11.0–15.0)
WBC: 9 10*3/uL (ref 3.8–10.8)

## 2016-10-21 LAB — TSH: TSH: 2.05 m[IU]/L

## 2016-10-21 MED ORDER — BUPROPION HCL ER (XL) 300 MG PO TB24: 300.0000 mg | ORAL_TABLET | Freq: Every day | ORAL | 3 refills | Status: AC

## 2016-10-21 NOTE — Progress Notes (Signed)
41 y.o. [id number] MarriedCaucasianF here for annual exam.   She has a h/o PCOS and endometriosis.  She c/o urethral pain the first couple of days of her cycle. She was seen by urology, evaluation was negative.  Menses are every month, 23-28 days. She bleeds x 5 days, can saturate a super tampon in 2 hours. She has back cramps prior to her cycle, subsides with her cycle, then gets severe urethral pain. Takes Ibuprofen which helps.  She c/o weight gain and fatigue. No constipation, dry skin or hair loss.  Period Duration (Days): 5 days Period Pattern: (!) Irregular Menstrual Flow: Light, Moderate, Heavy Menstrual Control: Tampon Menstrual Control Change Freq (Hours): changes tampon/Pad every 2 hours on heavy days  Dysmenorrhea: (!) Severe Dysmenorrhea Symptoms: Other (Comment)back pain  Patient's last menstrual period was 10/02/2016.          Sexually active: Yes.    The current method of family planning is vasectomy.    Exercising: Yes.    running  Smoker:  no  Health Maintenance: Pap:  05-06-15 WNL History of abnormal Pap:  Yes years ago, s/p cryosurgery MMG:  02/2015 WNL per patient  Colonoscopy:  Never BMD:   Never TDaP:  08-14-2011 Gardasil: N/A   reports that she has never smoked. She has never used smokeless tobacco. She reports that she does not drink alcohol or use drugs. She is a Futures traderhomemaker. Kids are 11, 9 and 5. The oldest is adopted.   Past Medical History:  Diagnosis Date  . Depression   . Endometriosis   . Infertility, female   . Migraine without aura   . PCOS (polycystic ovarian syndrome)   . Urinary incontinence     Past Surgical History:  Procedure Laterality Date  . Bone tumor in finger    . CRYOTHERAPY    . HYSTEROSCOPY    . LAPAROSCOPIC ENDOMETRIOSIS FULGURATION    . PELVIC LAPAROSCOPY    Laparoscopy x 1 with treatment of endometriosis. IVF x 2, both pregnancies were spontaneous  Current Outpatient Prescriptions  Medication Sig Dispense Refill  .  buPROPion (WELLBUTRIN XL) 300 MG 24 hr tablet TAKE 1 TABLET BY MOUTH EVERY DAY    . Multiple Vitamin (MULTIVITAMIN) capsule Take 1 capsule by mouth daily.     No current facility-administered medications for this visit.     Family History  Problem Relation Age of Onset  . Malignant hyperthermia Mother   . Diabetes Mother   . Malignant hyperthermia Maternal Aunt     Review of Systems  Constitutional: Negative.   HENT: Negative.   Eyes: Negative.   Respiratory: Negative.   Cardiovascular: Negative.   Gastrointestinal: Negative.   Endocrine: Negative.   Genitourinary: Negative.        Lower back pain with menstrual cycles  Musculoskeletal: Negative.   Skin: Negative.   Allergic/Immunologic: Negative.   Neurological: Negative.   Psychiatric/Behavioral: Negative.   She has GSI with exercise, mild, tolerable. She has seen PT in the past.  Exam:   BP 110/70 (BP Location: Right Arm, Patient Position: Sitting, Cuff Size: Normal)   Pulse 76   Resp 13   Ht 5' 3.5" (1.613 m)   Wt 191 lb (86.6 kg)   LMP 10/02/2016   Breastfeeding? No   BMI 33.30 kg/m   Weight change: @WEIGHTCHANGE @ Height:   Height: 5' 3.5" (161.3 cm)  Ht Readings from Last 3 Encounters:  10/21/16 5' 3.5" (1.613 m)  08/13/11 5\' 3"  (1.6 m)    General  appearance: alert, cooperative and appears stated age Head: Normocephalic, without obvious abnormality, atraumatic Neck: no adenopathy, supple, symmetrical, trachea midline and thyroid normal to inspection and palpation Lungs: clear to auscultation bilaterally Cardiovascular: regular rate and rhythm Breasts: normal appearance, no masses or tenderness Heart: regular rate and rhythm Abdomen: soft, non-tender; bowel sounds normal; no masses,  no organomegaly Extremities: extremities normal, atraumatic, no cyanosis or edema Skin: Skin color, texture, turgor normal. No rashes or lesions Lymph nodes: Cervical, supraclavicular, and axillary nodes normal. No abnormal  inguinal nodes palpated Neurologic: Grossly normal   Pelvic: External genitalia:  no lesions              Urethra:  normal appearing urethra with no masses, tenderness or lesions              Bartholins and Skenes: normal                 Vagina: normal appearing vagina with normal color and discharge, no lesions              Cervix: no lesions               Bimanual Exam:  Uterus:  normal size, contour, position, consistency, mobility, non-tender and retroverted              Adnexa: no mass, fullness, tenderness               Rectovaginal: Confirms               Anus:  normal sphincter tone, no lesions  Chaperone was present for exam.  A:  Well Woman with normal exam  H/O PCOS and endometriosis  Dysmenorrhea, aching in her urethral area during her cycle  Weight gain   H/O Depression  P:   Pap with hpv  CBC, CMP, lipids, HgbA1C, vit D, TSH  Mammogram  Discussed the option of OCP's or the mirena IUD to try and help with her pain with her cycles  She is interested in the mirena, information given and reviewed  Can have the mirena inserted at any time in her cycle (vasectomy for contraception)  Continue Wellbutrin

## 2016-10-21 NOTE — Patient Instructions (Signed)

## 2016-10-22 LAB — HEMOGLOBIN A1C
HEMOGLOBIN A1C: 4.8 % (ref <5.7)
Mean Plasma Glucose: 91 mg/dL

## 2016-10-22 LAB — VITAMIN D 25 HYDROXY (VIT D DEFICIENCY, FRACTURES): Vit D, 25-Hydroxy: 25 ng/mL — ABNORMAL LOW (ref 30–100)

## 2016-10-26 LAB — IPS PAP TEST WITH HPV

## 2016-11-02 ENCOUNTER — Telehealth: Payer: Self-pay

## 2016-11-02 MED ORDER — LO LOESTRIN FE 1 MG-10 MCG / 10 MCG PO TABS: 1.0000 | ORAL_TABLET | Freq: Every day | ORAL | 0 refills | Status: DC

## 2016-11-02 NOTE — Telephone Encounter (Signed)
Rx for Lo Loestrin 1 mg- 10 mcg #3 0RF sent to pharmacy on file. Left message for patient to call Charlotte Fidalgo at [phone] to schedule 3 month follow up.

## 2016-11-02 NOTE — Telephone Encounter (Signed)
Please send in 3 months, then set her up for a f/u appointment. Thanks!!

## 2016-11-02 NOTE — Telephone Encounter (Signed)
Spoke with patient. Advised of message as seen below from Dr.Jertson. Patient verbalizes understanding. Patient would like to start taking Lo Loestrin at this time. Would [facility] [place]. Advised I will speak with Dr.Jertson regarding quantity and follow up and return call. Patient is agreeable.  Dr.Jerston, would you like me to send in Lo Loestrin for 1 year or 3 months with follow up?

## 2016-11-02 NOTE — Telephone Encounter (Signed)
-----   Message from Romualdo BolkJill Evelyn Jertson, MD sent at 10/28/2016 11:51 AM EST ----- Please let the patient know that her vit D level is slightly low and she should starting taking 1,000 IU of vit D3 a day (long term). Her overall lipid panel is normal, but her good cholesterol (HDL) is low. This can increase her risk of heart disease. Eating a low fat diet and exercising can help. The rest of her lab work is normal. 02 I heard that a mirena IUD is not covered by her insurance. Tell her I'm really sorry and see if she wants to try a low dose OCP, we can even try loloestrin (lowest dose on the market, but not generic)

## 2016-11-05 NOTE — Telephone Encounter (Signed)
Left message to call Cherylanne Ardelean at [phone]. 

## 2016-11-09 NOTE — Telephone Encounter (Signed)
Will close encounter

## 2016-11-09 NOTE — Telephone Encounter (Signed)
Dr.Jertson, I have attempted to reach this patient x 2 with no return call to schedule 3 month follow up. 3 month supply only was sent to pharmacy on file. Okay to close as patient will have to call for refill at 3 months and can be scheduled? Or would you like me to send a letter?

## 2016-11-11 DIAGNOSIS — Z1231 Encounter for screening mammogram for malignant neoplasm of breast: Secondary | ICD-10-CM | POA: Diagnosis not present

## 2016-12-09 ENCOUNTER — Encounter: Payer: Self-pay | Admitting: Obstetrics and Gynecology

## 2017-01-01 DIAGNOSIS — Z20828 Contact with and (suspected) exposure to other viral communicable diseases: Secondary | ICD-10-CM | POA: Diagnosis not present

## 2017-01-11 DIAGNOSIS — M79639 Pain in unspecified forearm: Secondary | ICD-10-CM | POA: Diagnosis not present

## 2017-01-25 DIAGNOSIS — M79674 Pain in right toe(s): Secondary | ICD-10-CM | POA: Diagnosis not present

## 2017-01-25 DIAGNOSIS — M79671 Pain in right foot: Secondary | ICD-10-CM | POA: Diagnosis not present

## 2017-02-11 ENCOUNTER — Ambulatory Visit: Payer: BLUE CROSS/BLUE SHIELD | Admitting: Podiatry

## 2017-02-21 DIAGNOSIS — Z0000 Encounter for general adult medical examination without abnormal findings: Secondary | ICD-10-CM | POA: Diagnosis not present

## 2017-02-21 DIAGNOSIS — Z8639 Personal history of other endocrine, nutritional and metabolic disease: Secondary | ICD-10-CM | POA: Diagnosis not present

## 2017-02-21 DIAGNOSIS — Z6833 Body mass index (BMI) 33.0-33.9, adult: Secondary | ICD-10-CM | POA: Diagnosis not present

## 2017-02-21 DIAGNOSIS — E282 Polycystic ovarian syndrome: Secondary | ICD-10-CM | POA: Diagnosis not present

## 2017-02-22 DIAGNOSIS — L821 Other seborrheic keratosis: Secondary | ICD-10-CM | POA: Diagnosis not present

## 2017-02-22 DIAGNOSIS — D1801 Hemangioma of skin and subcutaneous tissue: Secondary | ICD-10-CM | POA: Diagnosis not present

## 2017-02-22 DIAGNOSIS — D2370 Other benign neoplasm of skin of unspecified lower limb, including hip: Secondary | ICD-10-CM | POA: Diagnosis not present

## 2017-02-22 DIAGNOSIS — D225 Melanocytic nevi of trunk: Secondary | ICD-10-CM | POA: Diagnosis not present

## 2017-03-04 ENCOUNTER — Telehealth: Payer: Self-pay | Admitting: *Deleted

## 2017-03-04 ENCOUNTER — Ambulatory Visit (INDEPENDENT_AMBULATORY_CARE_PROVIDER_SITE_OTHER): Payer: BLUE CROSS/BLUE SHIELD | Admitting: Podiatry

## 2017-03-04 ENCOUNTER — Encounter: Payer: Self-pay | Admitting: Podiatry

## 2017-03-04 ENCOUNTER — Ambulatory Visit (INDEPENDENT_AMBULATORY_CARE_PROVIDER_SITE_OTHER): Payer: BLUE CROSS/BLUE SHIELD

## 2017-03-04 VITALS — BP 96/85

## 2017-03-04 DIAGNOSIS — M79671 Pain in right foot: Secondary | ICD-10-CM

## 2017-03-04 DIAGNOSIS — M205X1 Other deformities of toe(s) (acquired), right foot: Secondary | ICD-10-CM

## 2017-03-04 DIAGNOSIS — M779 Enthesopathy, unspecified: Secondary | ICD-10-CM | POA: Diagnosis not present

## 2017-03-04 MED ORDER — TRIAMCINOLONE ACETONIDE 10 MG/ML IJ SUSP
10.0000 mg | Freq: Once | INTRAMUSCULAR | Status: AC
  Administered 2017-03-04: 10 mg

## 2017-03-04 NOTE — Telephone Encounter (Signed)
"  I was there to this morning and saw Dr. Charlsie Merlesegal.  I need to schedule my surgery."  Do you have a date in mind that you would like to do it on?  "Yes, I'd like to do it on July 24."  He doesn't have anything on that date but he can do it the following week on July 31.  "Can he do it the week prior to July 24?"  No, he does not have anything available on that date.  "Okay, I guess July 1 it is."  I need to change my appointment to see him."  I'll transfer you to a scheduler.

## 2017-03-04 NOTE — Progress Notes (Signed)
   Subjective:    Patient ID: Erika Farley, female    DOB: 04-22-1976, 41 y.o.   MRN: [id number]  HPI    Review of Systems  Musculoskeletal: Positive for back pain and joint swelling.       Objective:   Physical Exam        Assessment & Plan:

## 2017-03-05 NOTE — Progress Notes (Signed)
Subjective:    Patient ID: Erika SagesAngela M Farley, female   DOB: 41 y.o.   MRN: [id number]   HPI patient presents with significant discomfort in the big toe joint right of at least 6 months duration and she stated it bothered her off and on over the last few years    Review of Systems  All other systems reviewed and are negative.       Objective:  Physical Exam  Cardiovascular: Intact distal pulses.   Musculoskeletal: Normal range of motion.  Neurological: She is alert.  Skin: Skin is warm.  Nursing note and vitals reviewed.  neurovascular status intact muscle strength adequate range of motion within normal limits with loss of range of motion first MPJ right with dorsi flexion reduced to about 15 plantar flexion 10 with quite a bit of discomfort when the joint was palpated with signs of spur formation. The left has good range of motion with no indication of pathology and she's found have good digital perfusion and is well oriented 3     Assessment:   Hallux limitus rigidus deformity right which is structural in nature and probably related to genetics with no injury or no in issues with the left      Plan:    H&P conditions reviewed and discussed the right and at this point she does want surgery for this but would like to wait till the fall. I've recommended removal of dorsal bone spurs and probable shortening plantarflexed 3 osteotomy and I did explain ultimate fusion or implantation which may be necessary. At this time to try to buy her relief for several months a did inject around the joint 3 mg Kenalog 5 mill grams Xylocaine to reduce the capsular inflammation and I instructed on rigid bottom shoes. Reappoint in August with surgery tenably scheduled for September  X-rays indicate that there is spurring dorsal first metatarsal with a small loose fragment and narrowing of the joint surface

## 2017-03-30 ENCOUNTER — Telehealth: Payer: Self-pay | Admitting: *Deleted

## 2017-03-30 NOTE — Telephone Encounter (Signed)
"  I have surgery scheduled for July 31.  I want to see if it was possible to move it to the next week.  Please give me a call back."

## 2017-03-31 NOTE — Telephone Encounter (Signed)
I left her a message that I would reschedule surgery from July 31 to August 7.  I asked her to call me on Monday if she has any further questions.  I called and left a [facility] to reschedule the surgery from July 31 to August 7.

## 2017-04-20 ENCOUNTER — Ambulatory Visit: Payer: BLUE CROSS/BLUE SHIELD | Admitting: Podiatry

## 2017-04-21 ENCOUNTER — Ambulatory Visit (INDEPENDENT_AMBULATORY_CARE_PROVIDER_SITE_OTHER): Payer: BLUE CROSS/BLUE SHIELD | Admitting: Podiatry

## 2017-04-21 DIAGNOSIS — M205X1 Other deformities of toe(s) (acquired), right foot: Secondary | ICD-10-CM | POA: Diagnosis not present

## 2017-04-21 NOTE — Patient Instructions (Signed)

## 2017-04-21 NOTE — Progress Notes (Signed)
Subjective:    Patient ID: Erika SagesAngela M Ruz, female   DOB: 41 y.o.   MRN: [id number]   HPI patient presents stating I'm ready to get this foot fixed    ROS      Objective:  Physical Exam neurovascular status intact with patient found to have continued discomfort range of motion loss first MPJ right with mild crepitus in the joint and reduce dorsi flexion noted     Assessment:    Hallux limitus deformity right with inflammation of joint surface with some improvement that it occurred with injection but gradual reoccurrence of symptoms     Plan:    H&P condition reviewed and at this point I have recommended surgical intervention in this case. I did discuss with her the fact that ultimately may require joint implantation or fusion but hopefully by doing this early we can get symptoms under control. Patient wants surgery and after extensive review going over the consent form with alternative treatments complications reviewed she signs consent form. I then dispensed air fracture walker with all instructions on usage along with surgical shoe and she is given all instructions for the postop. Patient understands recovery can take 6 months to one year for this type procedure

## 2017-05-17 ENCOUNTER — Encounter: Payer: Self-pay | Admitting: Podiatry

## 2017-05-17 DIAGNOSIS — M2011 Hallux valgus (acquired), right foot: Secondary | ICD-10-CM | POA: Diagnosis not present

## 2017-05-17 DIAGNOSIS — M21611 Bunion of right foot: Secondary | ICD-10-CM | POA: Diagnosis not present

## 2017-05-17 DIAGNOSIS — M1990 Unspecified osteoarthritis, unspecified site: Secondary | ICD-10-CM | POA: Diagnosis not present

## 2017-05-18 ENCOUNTER — Telehealth: Payer: Self-pay | Admitting: *Deleted

## 2017-05-18 MED ORDER — LIDOCAINE 5 % EX OINT: 1.0000 "application " | TOPICAL_OINTMENT | CUTANEOUS | 5 refills | Status: DC | PRN

## 2017-05-18 NOTE — Telephone Encounter (Signed)
Pt states she is having achiness in the surgery foot, and would like to know if she could take ibuprofen. I told pt she could take ibuprofen OTC as instructed on package in between the percocet.

## 2017-05-20 ENCOUNTER — Ambulatory Visit: Payer: BLUE CROSS/BLUE SHIELD | Admitting: Podiatry

## 2017-05-23 ENCOUNTER — Encounter: Payer: Self-pay | Admitting: Podiatry

## 2017-05-23 ENCOUNTER — Ambulatory Visit (INDEPENDENT_AMBULATORY_CARE_PROVIDER_SITE_OTHER): Payer: BLUE CROSS/BLUE SHIELD | Admitting: Podiatry

## 2017-05-23 ENCOUNTER — Ambulatory Visit (INDEPENDENT_AMBULATORY_CARE_PROVIDER_SITE_OTHER): Payer: BLUE CROSS/BLUE SHIELD

## 2017-05-23 DIAGNOSIS — M779 Enthesopathy, unspecified: Secondary | ICD-10-CM

## 2017-05-23 DIAGNOSIS — M205X1 Other deformities of toe(s) (acquired), right foot: Secondary | ICD-10-CM

## 2017-05-25 NOTE — Progress Notes (Signed)
Subjective:    Patient ID: Erika SagesAngela M Farley, female   DOB: 41 y.o.   MRN: [id number]   HPI patient states she's doing very well with her right foot surgery    ROS      Objective:  Physical Exam neurovascular status found to be intact muscle strength was adequate range of motion within normal limits with patient found to have well-healed surgical site right first MPJ with wound edges well coapted and negative Homans sign was noted     Assessment:   Doing well post osteotomy first metatarsal right foot     Plan:   H&P x-rays reviewed and at this point I have recommended continued immobilization elevation compression and reapplied dressing. Reappoint for us to recheck  X-rays indicate pins are in place joint is congruence with no indication of movement

## 2017-05-30 NOTE — Progress Notes (Signed)
DOS 05/17/2017 [place] Bunionectomy Bi-Planar RT 

## 2017-06-15 ENCOUNTER — Ambulatory Visit (INDEPENDENT_AMBULATORY_CARE_PROVIDER_SITE_OTHER): Payer: BLUE CROSS/BLUE SHIELD | Admitting: Podiatry

## 2017-06-15 ENCOUNTER — Encounter: Payer: Self-pay | Admitting: Podiatry

## 2017-06-15 ENCOUNTER — Ambulatory Visit (INDEPENDENT_AMBULATORY_CARE_PROVIDER_SITE_OTHER): Payer: BLUE CROSS/BLUE SHIELD

## 2017-06-15 DIAGNOSIS — M205X1 Other deformities of toe(s) (acquired), right foot: Secondary | ICD-10-CM

## 2017-06-15 NOTE — Progress Notes (Signed)
Subjective:    Patient ID: Erika Farley, female   DOB: 41 y.o.   MRN: [id number]   HPI patient states she's doing real well with the right foot with minimal discomfort and able to walk without pain    ROS      Objective:  Physical Exam neurovascular status intact negative Homans sign noted wound edges well coapted right with good alignment and range of motion of the joint that's adequate with no crepitus of the joint surface     Assessment:    Doing well post osteotomy right first metatarsal     Plan:   H&P x-rays reviewed and encouraged range of motion first MPJ with gradual increase in activity levels. Reappoint 4 weeks or earlier if needed  X-rays indicate the osteotomy is healing well joint congruence with no indications of pathology

## 2017-07-11 ENCOUNTER — Ambulatory Visit: Payer: BLUE CROSS/BLUE SHIELD

## 2017-07-12 ENCOUNTER — Ambulatory Visit: Payer: BLUE CROSS/BLUE SHIELD

## 2017-07-20 ENCOUNTER — Ambulatory Visit (INDEPENDENT_AMBULATORY_CARE_PROVIDER_SITE_OTHER): Payer: BLUE CROSS/BLUE SHIELD

## 2017-07-20 ENCOUNTER — Ambulatory Visit (INDEPENDENT_AMBULATORY_CARE_PROVIDER_SITE_OTHER): Payer: BLUE CROSS/BLUE SHIELD | Admitting: Podiatry

## 2017-07-20 DIAGNOSIS — M205X1 Other deformities of toe(s) (acquired), right foot: Secondary | ICD-10-CM | POA: Diagnosis not present

## 2017-07-20 NOTE — Progress Notes (Signed)
Subjective:    Patient ID: Erika Farley, female   DOB: 41 y.o.   MRN: [id number]   HPI patient states doing real well with right foot with occasional discomfort but overall much better than preop    ROS      Objective:  Physical Exam neurovascular status intact negative Homans sign noted with wound edges well coapted right with good alignment and good range of motion with no crepitus the joint approximate 30 dorsiflexion     Assessment:   Doing well post osteotomy first metatarsal right      Plan:   Advised on anti-inflammatories continued activity and continued range of motion. Patient's discharge will be seen back as needed  X-rays indicate the osteotomy is healing well pins in place joint congruence and open

## 2017-08-15 ENCOUNTER — Ambulatory Visit (INDEPENDENT_AMBULATORY_CARE_PROVIDER_SITE_OTHER): Payer: BLUE CROSS/BLUE SHIELD

## 2017-08-15 ENCOUNTER — Ambulatory Visit (INDEPENDENT_AMBULATORY_CARE_PROVIDER_SITE_OTHER): Payer: BLUE CROSS/BLUE SHIELD | Admitting: Podiatry

## 2017-08-15 ENCOUNTER — Encounter: Payer: Self-pay | Admitting: Podiatry

## 2017-08-15 DIAGNOSIS — M2011 Hallux valgus (acquired), right foot: Secondary | ICD-10-CM

## 2017-08-15 DIAGNOSIS — M779 Enthesopathy, unspecified: Secondary | ICD-10-CM

## 2017-08-15 MED ORDER — TRIAMCINOLONE ACETONIDE 10 MG/ML IJ SUSP: 10.0000 mg | Freq: Once | INTRAMUSCULAR | Status: AC

## 2017-08-16 NOTE — Progress Notes (Signed)
Subjective:    Patient ID: Erika SagesAngela M Grieder, female   DOB: 41 y.o.   MRN: [id number]   HPI patient states doing well with pain in the bottom of the first metatarsal head and I'm due to go out of town the next few days    ROS      Objective:  Physical Exam neurovascular status intact with patient doing well post surgery of the right first MPJ with good range of motion and discomfort around the head of the first metatarsal plantar right where she is probably increasing weightbearing     Assessment:    May be increasing weightbearing secondary to being able to walk on the side of her foot again with inflammation around the plantar first metatarsal     Plan:     H&P x-ray reviewed and at this point I recommended orthotics to disperse weight off the first metatarsal head. [facility] and she will have this done in the next several weeks and it will offload the first metatarsal head. I dispensed dancer's pads today to accomplish this and patient will wear a more rigid type shoe and we seen by recommended next several weeks  X-rays indicate the osteotomy is healing well joint congruence pins in place with no other pathology noted

## 2017-09-05 ENCOUNTER — Ambulatory Visit (INDEPENDENT_AMBULATORY_CARE_PROVIDER_SITE_OTHER): Payer: BLUE CROSS/BLUE SHIELD | Admitting: Orthotics

## 2017-09-05 DIAGNOSIS — M7750 Other enthesopathy of unspecified foot: Secondary | ICD-10-CM | POA: Diagnosis not present

## 2017-09-05 DIAGNOSIS — M2011 Hallux valgus (acquired), right foot: Secondary | ICD-10-CM

## 2017-09-05 DIAGNOSIS — M205X1 Other deformities of toe(s) (acquired), right foot: Secondary | ICD-10-CM

## 2017-09-05 DIAGNOSIS — M779 Enthesopathy, unspecified: Secondary | ICD-10-CM

## 2017-09-05 NOTE — Progress Notes (Signed)
Patient came into today to be cast for [employer]. Upon recommendation of Dr. Paulla Dolly Patient presents with metararsalgia, pain under 1st mpj, right, hx of buinioectomy Goals are archsupport and off load first MPJ w/ k-wedge and dancers pad w/ 1s met cutout.  Plan vendor [place]

## 2017-09-27 ENCOUNTER — Encounter: Payer: BLUE CROSS/BLUE SHIELD | Admitting: Orthotics

## 2017-09-27 DIAGNOSIS — F411 Generalized anxiety disorder: Secondary | ICD-10-CM | POA: Diagnosis not present

## 2017-10-05 ENCOUNTER — Ambulatory Visit: Payer: BLUE CROSS/BLUE SHIELD | Admitting: Orthotics

## 2017-10-05 DIAGNOSIS — M2011 Hallux valgus (acquired), right foot: Secondary | ICD-10-CM

## 2017-10-05 DIAGNOSIS — M205X1 Other deformities of toe(s) (acquired), right foot: Secondary | ICD-10-CM

## 2017-10-05 NOTE — Progress Notes (Signed)
Patient came in to pick  Up f/ot and wasn't satisfied with results.  Arch wasn't hugged and forefoot was too thick w/ k-wedge out of dancers pad. [facility]

## 2017-10-26 ENCOUNTER — Ambulatory Visit: Payer: BLUE CROSS/BLUE SHIELD | Admitting: Orthotics

## 2017-10-26 DIAGNOSIS — M205X1 Other deformities of toe(s) (acquired), right foot: Secondary | ICD-10-CM

## 2017-10-26 DIAGNOSIS — M2011 Hallux valgus (acquired), right foot: Secondary | ICD-10-CM

## 2017-10-26 NOTE — Progress Notes (Signed)
Patient picked up redone f/o..still had concern about the bulkiness of k-wedge in shoe.  I took some material off plantar eva k-wedge and she seemed happier with it.  Advised of break in period.

## 2017-10-27 ENCOUNTER — Ambulatory Visit: Payer: BLUE CROSS/BLUE SHIELD | Admitting: Obstetrics and Gynecology

## 2017-11-03 ENCOUNTER — Ambulatory Visit (INDEPENDENT_AMBULATORY_CARE_PROVIDER_SITE_OTHER): Payer: BLUE CROSS/BLUE SHIELD

## 2017-11-03 ENCOUNTER — Encounter: Payer: Self-pay | Admitting: Podiatry

## 2017-11-03 ENCOUNTER — Ambulatory Visit (INDEPENDENT_AMBULATORY_CARE_PROVIDER_SITE_OTHER): Payer: BLUE CROSS/BLUE SHIELD | Admitting: Podiatry

## 2017-11-03 DIAGNOSIS — M779 Enthesopathy, unspecified: Secondary | ICD-10-CM

## 2017-11-03 DIAGNOSIS — M2011 Hallux valgus (acquired), right foot: Secondary | ICD-10-CM

## 2017-11-03 DIAGNOSIS — Z969 Presence of functional implant, unspecified: Secondary | ICD-10-CM | POA: Diagnosis not present

## 2017-11-03 NOTE — Patient Instructions (Signed)
Pre-Operative Instructions  Congratulations, you have decided to take an important step towards improving your quality of life.  You can be assured that the doctors and staff at [facility] will be with you every step of the way.  Here are some important things you should know:  1. Plan to be at the surgery center/hospital at least 1 (one) hour prior to your scheduled time, unless otherwise directed by the surgical center/hospital staff.  You must have a responsible adult accompany you, remain during the surgery and drive you home.  Make sure you have directions to the surgical center/hospital to ensure you arrive on time. 2. If you are having surgery at [facility] or [facility], you will need a copy of your medical history and physical form from your family physician within one month prior to the date of surgery. We will give you a form for your primary physician to complete.  3. We make every effort to accommodate the date you request for surgery.  However, there are times where surgery dates or times have to be moved.  We will contact you as soon as possible if a change in schedule is required.   4. No aspirin/ibuprofen for one week before surgery.  If you are on aspirin, any non-steroidal anti-inflammatory medications (Mobic, Aleve, Ibuprofen) should not be taken seven (7) days prior to your surgery.  You make take Tylenol for pain prior to surgery.  5. Medications - If you are taking daily heart and blood pressure medications, seizure, reflux, allergy, asthma, anxiety, pain or diabetes medications, make sure you notify the surgery center/hospital before the day of surgery so they can tell you which medications you should take or avoid the day of surgery. 6. No food or drink after midnight the night before surgery unless directed otherwise by surgical center/hospital staff. 7. No alcoholic beverages 24-hours prior to surgery.  No smoking 24-hours prior or 24-hours after  surgery. 8. Wear loose pants or shorts. They should be loose enough to fit over bandages, boots, and casts. 9. Don't wear slip-on shoes. Sneakers are preferred. 10. Bring your boot with you to the surgery center/hospital.  Also bring crutches or a walker if your physician has prescribed it for you.  If you do not have this equipment, it will be provided for you after surgery. 11. If you have not been contacted by the surgery center/hospital by the day before your surgery, call to confirm the date and time of your surgery. 12. Leave-time from work may vary depending on the type of surgery you have.  Appropriate arrangements should be made prior to surgery with your employer. 13. Prescriptions will be provided immediately following surgery by your doctor.  Fill these as soon as possible after surgery and take the medication as directed. Pain medications will not be refilled on weekends and must be approved by the doctor. 14. Remove nail polish on the operative foot and avoid getting pedicures prior to surgery. 15. Wash the night before surgery.  The night before surgery wash the foot and leg well with water and the antibacterial soap provided. Be sure to pay special attention to beneath the toenails and in between the toes.  Wash for at least three (3) minutes. Rinse thoroughly with water and dry well with a towel.  Perform this wash unless told not to do so by your physician.  Enclosed: 1 Ice pack (please put in freezer the night before surgery)   1 Hibiclens skin cleaner     Pre-op instructions  If you have any questions regarding the instructions, please do not hesitate to call our office.  [place]: [address], [place], [place] [address] -- [phone]  [place]: [address] [place], [place] [address] -- [phone]  [place]: [address]  [place], [place] [address] -- [phone]  [place]: [address], [address], [place],  [address] -- [phone]  Website:  [email or web address] 

## 2017-11-03 NOTE — Progress Notes (Signed)
Subjective:   Patient ID: Erika SagesAngela M Babin, female   DOB: 42 y.o.   MRN: [id number]   HPI Patient states she is having pain underneath the right foot and she is now doing a workout class where she puts a lot of stress on her foot.  States it seems to be underneath but she is not sure and she feels like she has a lot of tingling at different times   ROS      Objective:  Physical Exam  Neurovascular status intact with patient's range of motion first MPJ excellent with no crepitus of the joint wound edges well coapted with discomfort in the plantar first metatarsal and around the fibular sesamoid that is localized in nature     Assessment:  Probability that this is inflammatory but also possibility that it may be related to the metal that is been implanted     Plan:  H&P x-ray reviewed and at this point I recommended removal of the 2 pins and I explained procedure and risk.  Patient wants surgery and will be done in office and at this time she is scheduled for this after review consent form going over alternative treatments complications.  She understands no guarantee that this will solve her problem and we will also inject the first MPJ at the time of the procedure  X-rays indicate joint looks very healthy and open with no spur formation pins in place

## 2017-11-16 ENCOUNTER — Ambulatory Visit (INDEPENDENT_AMBULATORY_CARE_PROVIDER_SITE_OTHER): Payer: BLUE CROSS/BLUE SHIELD | Admitting: Podiatry

## 2017-11-16 ENCOUNTER — Encounter: Payer: Self-pay | Admitting: Podiatry

## 2017-11-16 VITALS — BP 119/69 | HR 80 | Temp 97.7°F | Resp 16

## 2017-11-16 DIAGNOSIS — Z969 Presence of functional implant, unspecified: Secondary | ICD-10-CM

## 2017-11-16 NOTE — Progress Notes (Signed)
Subjective:   Patient ID: Erika Farley, female   DOB: 42 y.o.   MRN: [id number]   HPI Patient presents for the removal of 2 pins from the dorsal right foot with discomfort in the joint of a relative chronic nature that creates some low-grade swelling   ROS      Objective:  Physical Exam  Neurovascular status intact with patient found to have mild prominence dorsally with the possibility for metal irritation or allergic type reaction     Assessment:  Abnormal pin position right with possibility for allergic reaction     Plan:  H&P condition reviewed and at this time injected with 60 mg Xylocaine Marcaine mixture brought patient to the OR and did sterile prep of the right foot.  Inflated the tourniquet to 250 mmHg and following procedures were performed.  An incision was made dorsal right first metatarsal taken through subcu tissue with hemostasis being acquired as necessary and further down to capsule with a linear capsular incision was performed.  The capsular tissue was sharply dissected off the underlying bone and I noted the pin to be in abnormal position and the first metatarsal right and utilizing sharp and blunt technique the pin was removed in toto.  It was flushed with copious amounts sterile Garamycin solution and I then went ahead and made a small second incision and the procedure was repeated for removal of second pin.  I sutured both incisions with 4-0 nylon and sterile dressing was applied with the tourniquet released and capillary fill noted to be immediate to all digits on the toes.  Reappoint in 2 weeks for suture removal or earlier if needed

## 2017-11-30 ENCOUNTER — Ambulatory Visit (INDEPENDENT_AMBULATORY_CARE_PROVIDER_SITE_OTHER): Payer: BLUE CROSS/BLUE SHIELD

## 2017-11-30 ENCOUNTER — Encounter: Payer: Self-pay | Admitting: Podiatry

## 2017-11-30 ENCOUNTER — Ambulatory Visit (INDEPENDENT_AMBULATORY_CARE_PROVIDER_SITE_OTHER): Payer: BLUE CROSS/BLUE SHIELD | Admitting: Podiatry

## 2017-11-30 DIAGNOSIS — M205X1 Other deformities of toe(s) (acquired), right foot: Secondary | ICD-10-CM

## 2017-11-30 DIAGNOSIS — M2011 Hallux valgus (acquired), right foot: Secondary | ICD-10-CM

## 2017-11-30 DIAGNOSIS — Z969 Presence of functional implant, unspecified: Secondary | ICD-10-CM

## 2017-12-01 NOTE — Progress Notes (Signed)
Subjective:   Patient ID: Erika SagesAngela M Scherger, female   DOB: 42 y.o.   MRN: [id number]   HPI Patient presents stating that she is doing very well and notices a diminishment of her discomfort   ROS      Objective:  Physical Exam  Neurovascular status intact with incision site well coapted with patient admitting she took the stitches out at home on her own     Assessment:  Doing well post pin removal x2 right     Plan:  H&P x-ray reviewed and allow patient to return to normal activity and hopefully this will remove and reduce all remaining symptoms she is experienced from her structural correction of her bunion  X-ray indicates satisfactory removal of pins with good alignment joint congruous no signs of pathology

## 2017-12-07 DIAGNOSIS — Z1231 Encounter for screening mammogram for malignant neoplasm of breast: Secondary | ICD-10-CM | POA: Diagnosis not present

## 2017-12-12 ENCOUNTER — Encounter: Payer: Self-pay | Admitting: Obstetrics and Gynecology

## 2018-01-09 DIAGNOSIS — M9903 Segmental and somatic dysfunction of lumbar region: Secondary | ICD-10-CM | POA: Diagnosis not present

## 2018-01-09 DIAGNOSIS — M9901 Segmental and somatic dysfunction of cervical region: Secondary | ICD-10-CM | POA: Diagnosis not present

## 2018-01-09 DIAGNOSIS — M9902 Segmental and somatic dysfunction of thoracic region: Secondary | ICD-10-CM | POA: Diagnosis not present

## 2018-01-09 DIAGNOSIS — M62830 Muscle spasm of back: Secondary | ICD-10-CM | POA: Diagnosis not present

## 2018-01-11 DIAGNOSIS — M9902 Segmental and somatic dysfunction of thoracic region: Secondary | ICD-10-CM | POA: Diagnosis not present

## 2018-01-11 DIAGNOSIS — M9901 Segmental and somatic dysfunction of cervical region: Secondary | ICD-10-CM | POA: Diagnosis not present

## 2018-01-11 DIAGNOSIS — M62830 Muscle spasm of back: Secondary | ICD-10-CM | POA: Diagnosis not present

## 2018-01-11 DIAGNOSIS — M9903 Segmental and somatic dysfunction of lumbar region: Secondary | ICD-10-CM | POA: Diagnosis not present

## 2018-01-13 DIAGNOSIS — M545 Low back pain: Secondary | ICD-10-CM | POA: Diagnosis not present

## 2018-01-13 DIAGNOSIS — M9901 Segmental and somatic dysfunction of cervical region: Secondary | ICD-10-CM | POA: Diagnosis not present

## 2018-01-13 DIAGNOSIS — M9903 Segmental and somatic dysfunction of lumbar region: Secondary | ICD-10-CM | POA: Diagnosis not present

## 2018-01-13 DIAGNOSIS — M62838 Other muscle spasm: Secondary | ICD-10-CM | POA: Diagnosis not present

## 2018-01-13 DIAGNOSIS — M62830 Muscle spasm of back: Secondary | ICD-10-CM | POA: Diagnosis not present

## 2018-01-13 DIAGNOSIS — M9902 Segmental and somatic dysfunction of thoracic region: Secondary | ICD-10-CM | POA: Diagnosis not present

## 2018-01-16 ENCOUNTER — Ambulatory Visit: Payer: BLUE CROSS/BLUE SHIELD | Admitting: Sports Medicine

## 2018-01-16 DIAGNOSIS — M62830 Muscle spasm of back: Secondary | ICD-10-CM | POA: Diagnosis not present

## 2018-01-16 DIAGNOSIS — M9902 Segmental and somatic dysfunction of thoracic region: Secondary | ICD-10-CM | POA: Diagnosis not present

## 2018-01-16 DIAGNOSIS — M9903 Segmental and somatic dysfunction of lumbar region: Secondary | ICD-10-CM | POA: Diagnosis not present

## 2018-01-16 DIAGNOSIS — M9901 Segmental and somatic dysfunction of cervical region: Secondary | ICD-10-CM | POA: Diagnosis not present

## 2018-01-18 ENCOUNTER — Ambulatory Visit
Admission: RE | Admit: 2018-01-18 | Discharge: 2018-01-18 | Disposition: A | Discharge status: Home / Self Care | Payer: BLUE CROSS/BLUE SHIELD | Source: Ambulatory Visit | Attending: Family Medicine | Admitting: Family Medicine

## 2018-01-18 ENCOUNTER — Encounter: Payer: Self-pay | Admitting: Family Medicine

## 2018-01-18 ENCOUNTER — Ambulatory Visit (INDEPENDENT_AMBULATORY_CARE_PROVIDER_SITE_OTHER): Payer: BLUE CROSS/BLUE SHIELD | Admitting: Family Medicine

## 2018-01-18 VITALS — BP 110/78 | Ht 63.0 in | Wt 185.0 lb

## 2018-01-18 DIAGNOSIS — M9901 Segmental and somatic dysfunction of cervical region: Secondary | ICD-10-CM | POA: Diagnosis not present

## 2018-01-18 DIAGNOSIS — M62830 Muscle spasm of back: Secondary | ICD-10-CM | POA: Diagnosis not present

## 2018-01-18 DIAGNOSIS — M545 Low back pain, unspecified: Secondary | ICD-10-CM

## 2018-01-18 DIAGNOSIS — M9903 Segmental and somatic dysfunction of lumbar region: Secondary | ICD-10-CM | POA: Diagnosis not present

## 2018-01-18 DIAGNOSIS — M9902 Segmental and somatic dysfunction of thoracic region: Secondary | ICD-10-CM | POA: Diagnosis not present

## 2018-01-18 DIAGNOSIS — M48061 Spinal stenosis, lumbar region without neurogenic claudication: Secondary | ICD-10-CM | POA: Diagnosis not present

## 2018-01-18 IMAGING — CR DG LUMBAR SPINE COMPLETE 4+V
5 series · 5 of 5 positions shown · non-contrast
Comparison: [DATE] CT abdomen and pelvis

CLINICAL DATA: Low back pain for 2 weeks, no known injury, initial
encounter

EXAM:
LUMBAR SPINE - COMPLETE 4+ VIEW

[w lumbar spine ap]
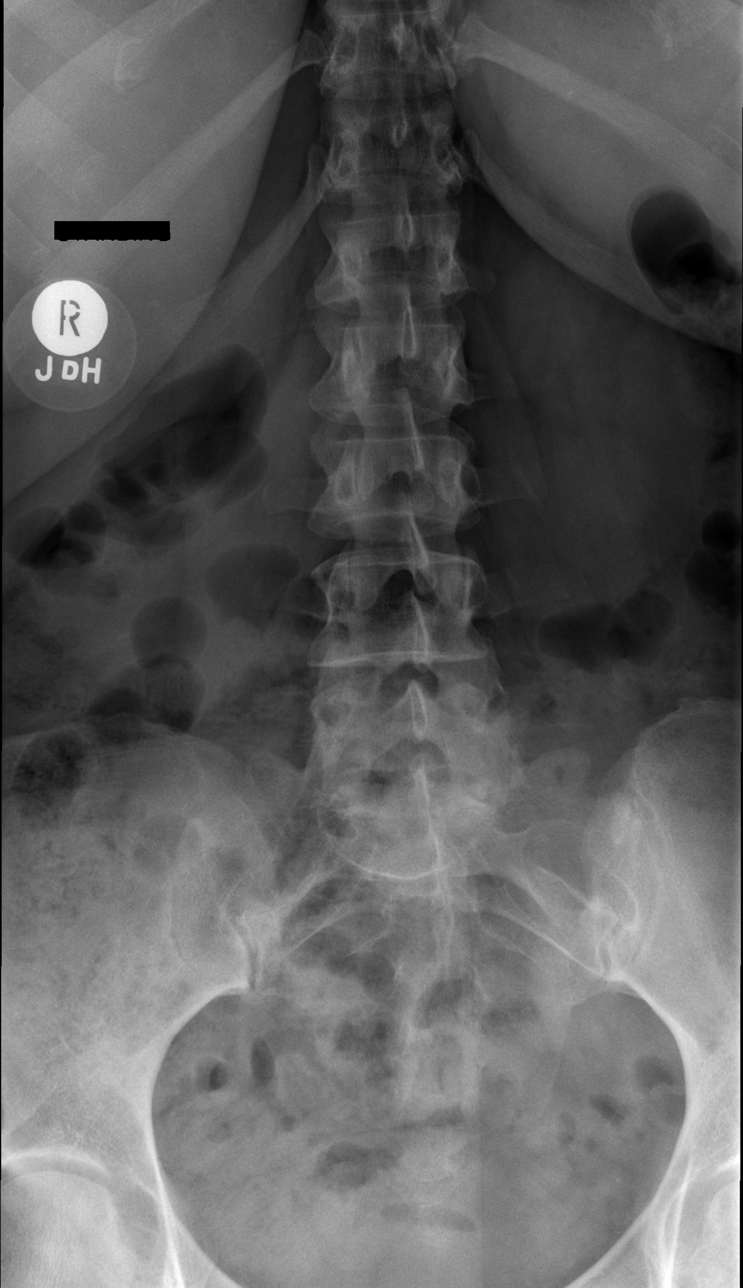

[w lumbar spine obl (1 of 2)]
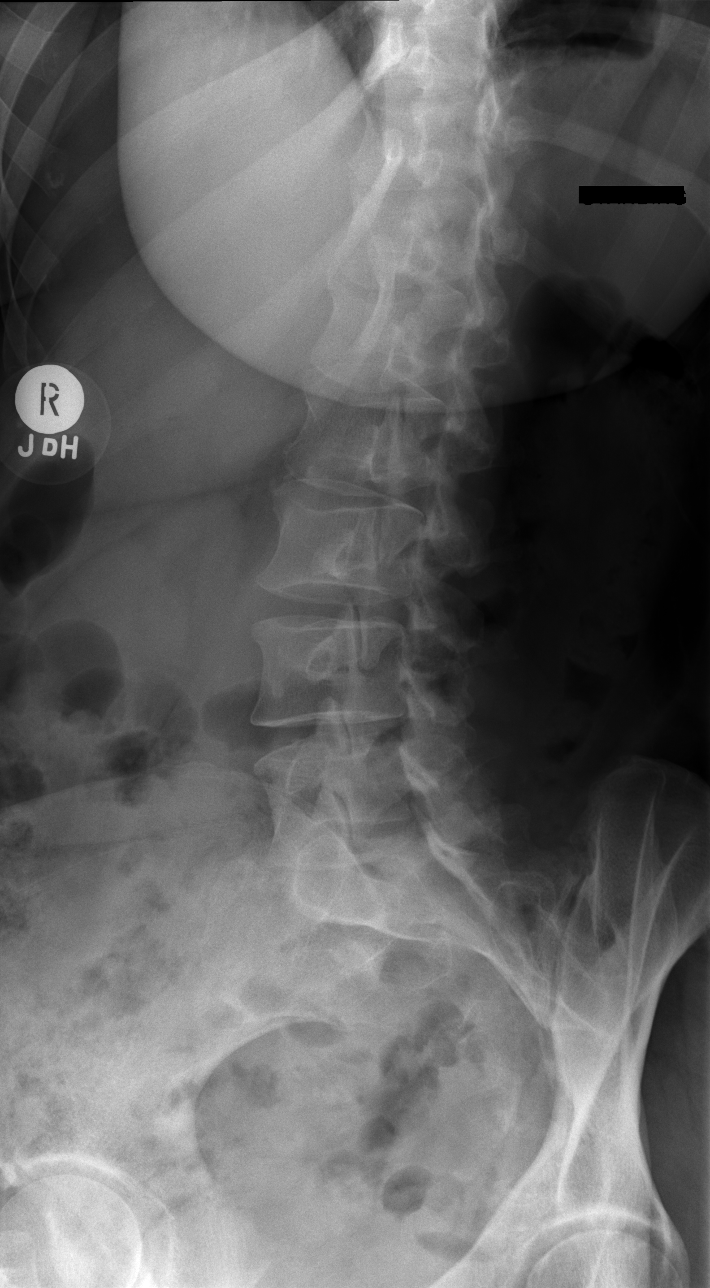

[w lumbar spine obl (2 of 2)]
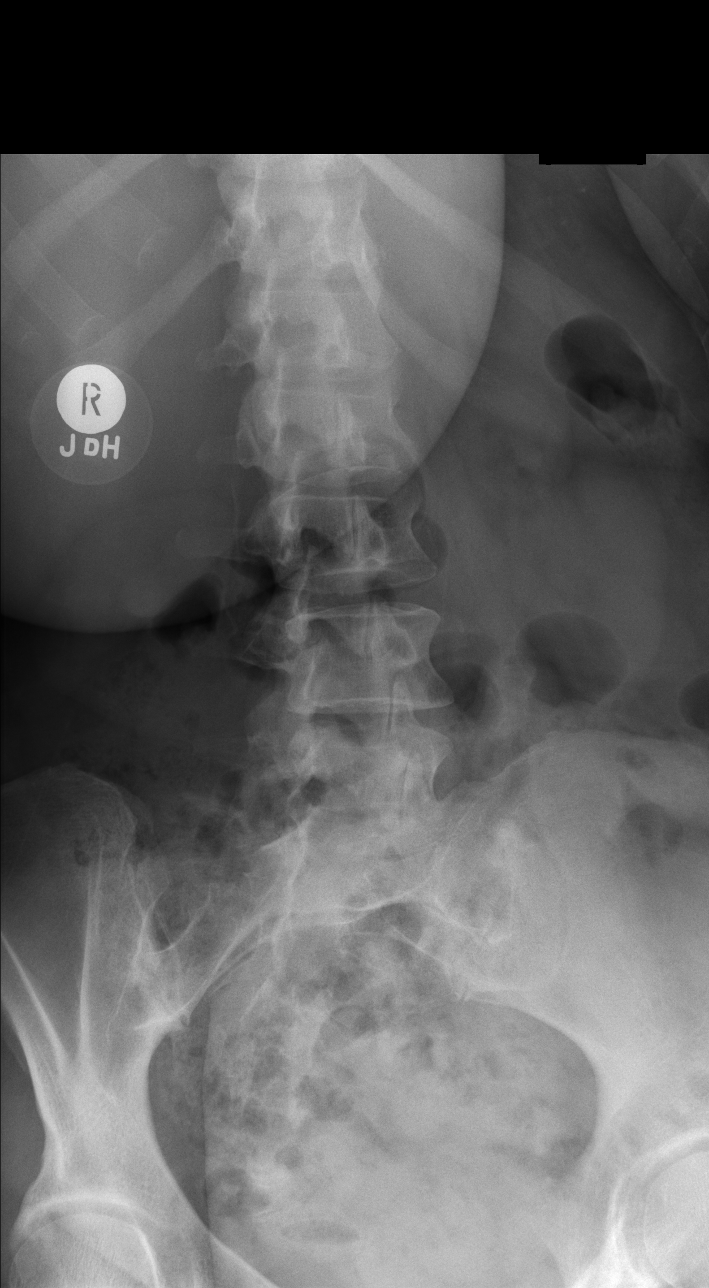

[w lumbar spine lat]
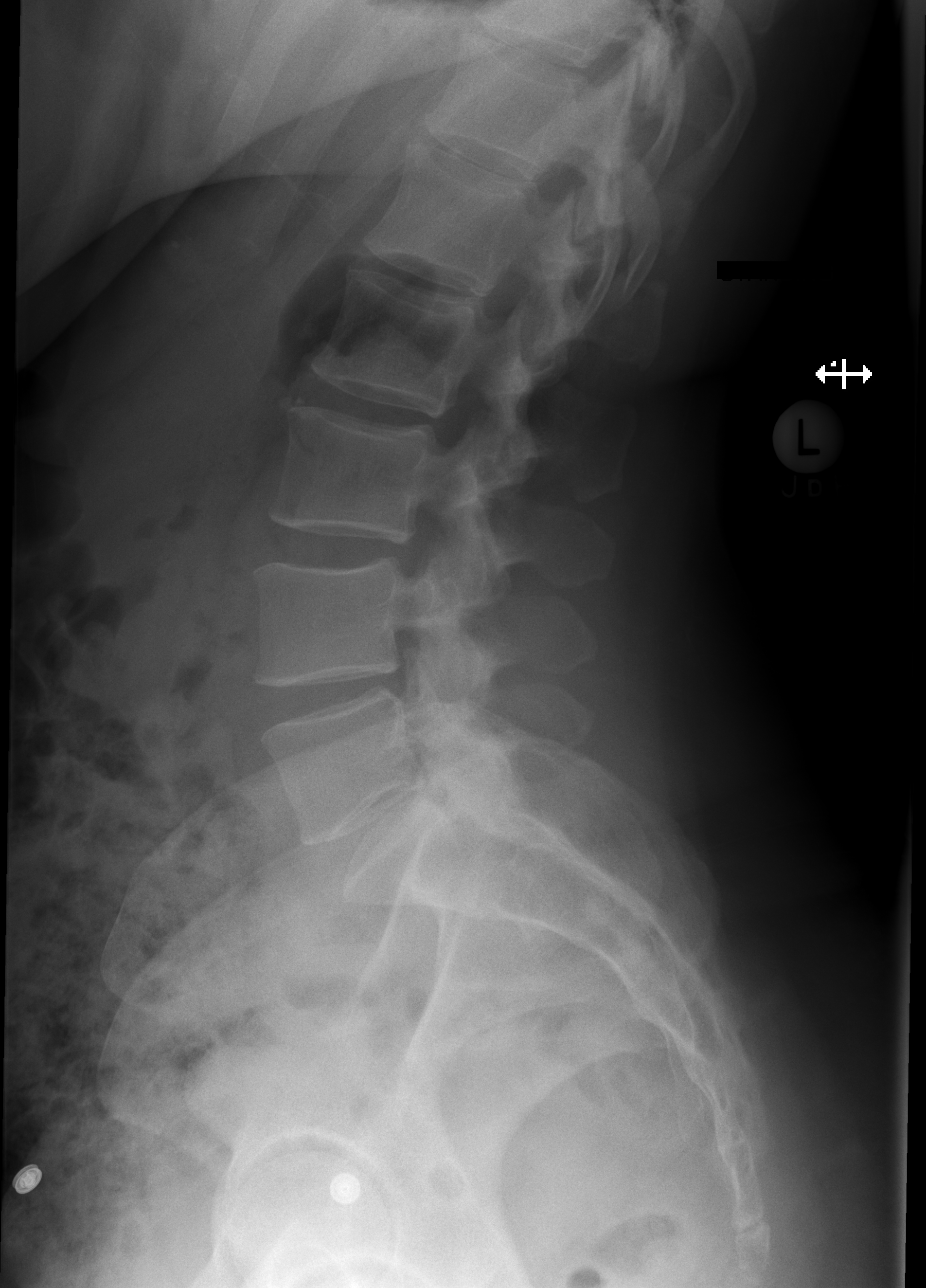

[w lumbar l-5 s-1 spot]
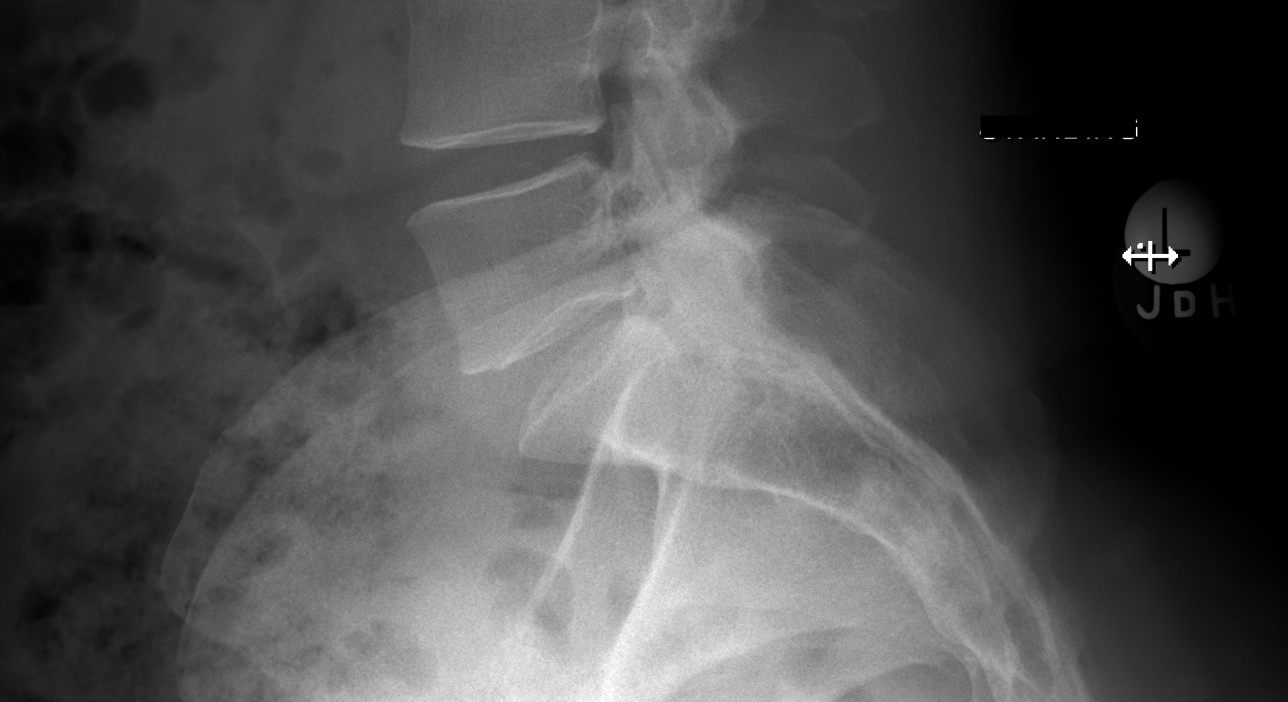

[5 of 5 positions shown; findings below may reference images not displayed]

FINDINGS: Five lumbar type vertebral bodies are well visualized. Vertebral
body height is well maintained. No pars defects are seen. No
anterolisthesis is noted. Very minimal disc space narrowing is noted
at L4-5.
IMPRESSION: Mild degenerative change without acute abnormality. The overall
appearance is similar to that seen on the prior exam.

## 2018-01-18 NOTE — Progress Notes (Signed)
HPI  CC: Acute low back pain Patient is here with complaints of acute onset lumbar back pain.  She states that this pain began approximately 2 weeks ago.  She states that she has begun a "boot camp" workout series.  Approximately 2 weeks ago she states that during 1 of these workouts she was doing "wall Burpee's" and "donkey kicks" and began to develop some achy lower back pain.  This back pain gradually got worse over the next few hours upon completion of this workout.  There was no single event in which the pain presented. Pain is described as sharp and aching across the midline and paraspinal musculature of the lumbar spine.  She denies any radiating symptoms into the buttock or down the legs.  Pain has persisted since onset and is constant in nature.  She had gone to a chiropractor for 5 separate sessions which produced very little improvement.  Eventually she went to her PCP and received a prednisone Dosepak and IM steroid injection.  She states that she has had significantly improved symptoms since these treatments.  Patient still has 1 day left on the prednisone Dosepak.  Patient is most worried that she may injure herself further if she continues this workup plan.  She denies any weakness, numbness, paresthesias, or bowel/bladder incontinence.  Traumatic: Yes, as above.  Location: Lower lumbar spine Quality: Achy occasionally sharp and sore  Duration: 2 weeks Timing: Worse with twisting and extension.   Improving/Worsening: Improving since taking the prednisone, otherwise unchanged Makes better: Rest Makes worse: Twisting activities Associated symptoms: None  Previous Interventions Tried: Flexeril (no improvement), ibuprofen (no improvement), chiropractor (no improvement), oral/IM steroids (improved)  Past Injuries: none  Past Surgeries: Prior foot surgery Smoking: Non-smoker Family Hx: Noncontributory  ROS: Per HPI; in addition no fever, no rash, no additional weakness, no  additional numbness, no additional paresthesias, and no additional falls/injury.   Objective: BP 110/78   Ht 5\' 3"  (1.6 m)   Wt 185 lb (83.9 kg)   BMI 32.77 kg/m  Gen: NAD, well groomed, a/o x3, normal affect.  CV: Well-perfused. Warm.  Resp: Non-labored.  Neuro: Sensation intact throughout. No gross coordination deficits. DTRs +2 bilaterally. Gait: Nonpathologic posture, unremarkable stride without signs of limp or balance issues. Back: Inspection yields no evidence of erythema, ecchymosis, or bony deformity.  Somewhat vague, nonspecific TTP across the L4-S1 region with some tenderness along the spinous processes as well as the paralumbar musculature.  Range of motion intact.  No worsening of symptoms with forward flexion.  Minimal discomfort with back extension.  No significant pain with single leg back extension.  Negative SLR.  Strength 5/5 throughout.  Neurovascular intact distally.  Assessment and Plan:  Acute midline low back pain without sciatica Patient is here with acute onset low back pain without evidence of sciatica.  Physical exam was relatively reassuring but she did have some midline tenderness along the L4-5 region.  Symptoms are most likely myofascial in origin (paraspinal lumbar muscle strain) but patient's workout routine puts her at risk for an extension type bony injury (spondylolysis).  After a lengthy discussion the decision was made to get an x-ray to help rule out any acute bony abnormalities. -X-ray obtained today >> No acute bony abnormalities appreciated. Possible very mild L4-5 facet degen present but otherwise unremarkable (still waiting for official radiologist read). I discussed results w/ patient over the phone and informed her I'd call again tomorrow if radiologist read shows any significant findings I did  not pick up. -Home exercises provided today: Focus on hip abductor and core strength -Patient can resume physical activity and use pain as guide, avoiding  aggravating activities (especially those for sitting explosive back extension). -OTC anti-inflammatories as needed. -F/u PRN  Next: could consider MRI if patient desires or if symptoms persist/worsen, or radicular sxs present.   Orders Placed This Encounter  Procedures  . DG Lumbar Spine Complete    Standing Status:   Future    Number of Occurrences:   1    Standing Expiration Date:   03/21/2019    Order Specific Question:   Reason for Exam (SYMPTOM  OR DIAGNOSIS REQUIRED)    Answer:   low back pain    Order Specific Question:   Is patient pregnant?    Answer:   No    Order Specific Question:   Preferred imaging location?    Answer:   [facility]    Order Specific Question:   Radiology Contrast Protocol - do NOT remove file path    Answer:   [email or web address]    Kathee Delton, MD,MS [facility] Sports Medicine Fellow 01/18/2018 5:59 PM

## 2018-01-18 NOTE — Assessment & Plan Note (Addendum)
Patient is here with acute onset low back pain without evidence of sciatica.  Physical exam was relatively reassuring but she did have some midline tenderness along the L4-5 region.  Symptoms are most likely myofascial in origin (paraspinal lumbar muscle strain) but patient's workout routine puts her at risk for an extension type bony injury (spondylolysis).  After a lengthy discussion the decision was made to get an x-ray to help rule out any acute bony abnormalities. -X-ray obtained today >> No acute bony abnormalities appreciated. Possible very mild L4-5 facet degen present but otherwise unremarkable (still waiting for official radiologist read). I discussed results w/ patient over the phone and informed her I'd call again tomorrow if radiologist read shows any significant findings I did not pick up. -Home exercises provided today: Focus on hip abductor and core strength -Patient can resume physical activity and use pain as guide, avoiding aggravating activities (especially those for sitting explosive back extension). -OTC anti-inflammatories as needed. -F/u PRN  Next: could consider MRI if patient desires or if symptoms persist/worsen, or radicular sxs present.

## 2018-01-20 DIAGNOSIS — M9903 Segmental and somatic dysfunction of lumbar region: Secondary | ICD-10-CM | POA: Diagnosis not present

## 2018-01-20 DIAGNOSIS — M62830 Muscle spasm of back: Secondary | ICD-10-CM | POA: Diagnosis not present

## 2018-01-20 DIAGNOSIS — M9901 Segmental and somatic dysfunction of cervical region: Secondary | ICD-10-CM | POA: Diagnosis not present

## 2018-01-20 DIAGNOSIS — M9902 Segmental and somatic dysfunction of thoracic region: Secondary | ICD-10-CM | POA: Diagnosis not present

## 2018-01-23 DIAGNOSIS — M9901 Segmental and somatic dysfunction of cervical region: Secondary | ICD-10-CM | POA: Diagnosis not present

## 2018-01-23 DIAGNOSIS — M9903 Segmental and somatic dysfunction of lumbar region: Secondary | ICD-10-CM | POA: Diagnosis not present

## 2018-01-23 DIAGNOSIS — M62830 Muscle spasm of back: Secondary | ICD-10-CM | POA: Diagnosis not present

## 2018-01-23 DIAGNOSIS — M9902 Segmental and somatic dysfunction of thoracic region: Secondary | ICD-10-CM | POA: Diagnosis not present

## 2018-01-25 DIAGNOSIS — M62830 Muscle spasm of back: Secondary | ICD-10-CM | POA: Diagnosis not present

## 2018-01-25 DIAGNOSIS — M9903 Segmental and somatic dysfunction of lumbar region: Secondary | ICD-10-CM | POA: Diagnosis not present

## 2018-01-25 DIAGNOSIS — M9901 Segmental and somatic dysfunction of cervical region: Secondary | ICD-10-CM | POA: Diagnosis not present

## 2018-01-25 DIAGNOSIS — M9902 Segmental and somatic dysfunction of thoracic region: Secondary | ICD-10-CM | POA: Diagnosis not present

## 2018-01-30 DIAGNOSIS — M9901 Segmental and somatic dysfunction of cervical region: Secondary | ICD-10-CM | POA: Diagnosis not present

## 2018-01-30 DIAGNOSIS — M62830 Muscle spasm of back: Secondary | ICD-10-CM | POA: Diagnosis not present

## 2018-01-30 DIAGNOSIS — M9903 Segmental and somatic dysfunction of lumbar region: Secondary | ICD-10-CM | POA: Diagnosis not present

## 2018-01-30 DIAGNOSIS — M9902 Segmental and somatic dysfunction of thoracic region: Secondary | ICD-10-CM | POA: Diagnosis not present

## 2018-02-02 DIAGNOSIS — M9902 Segmental and somatic dysfunction of thoracic region: Secondary | ICD-10-CM | POA: Diagnosis not present

## 2018-02-02 DIAGNOSIS — M9903 Segmental and somatic dysfunction of lumbar region: Secondary | ICD-10-CM | POA: Diagnosis not present

## 2018-02-02 DIAGNOSIS — M9901 Segmental and somatic dysfunction of cervical region: Secondary | ICD-10-CM | POA: Diagnosis not present

## 2018-02-02 DIAGNOSIS — M62830 Muscle spasm of back: Secondary | ICD-10-CM | POA: Diagnosis not present

## 2018-08-25 DIAGNOSIS — J029 Acute pharyngitis, unspecified: Secondary | ICD-10-CM | POA: Diagnosis not present

## 2018-09-27 DIAGNOSIS — Z8639 Personal history of other endocrine, nutritional and metabolic disease: Secondary | ICD-10-CM | POA: Diagnosis not present

## 2018-09-27 DIAGNOSIS — Z0000 Encounter for general adult medical examination without abnormal findings: Secondary | ICD-10-CM | POA: Diagnosis not present

## 2018-10-06 DIAGNOSIS — F411 Generalized anxiety disorder: Secondary | ICD-10-CM | POA: Diagnosis not present

## 2018-10-06 DIAGNOSIS — Z6834 Body mass index (BMI) 34.0-34.9, adult: Secondary | ICD-10-CM | POA: Diagnosis not present

## 2018-10-06 DIAGNOSIS — Z0000 Encounter for general adult medical examination without abnormal findings: Secondary | ICD-10-CM | POA: Diagnosis not present

## 2018-10-16 DIAGNOSIS — E669 Obesity, unspecified: Secondary | ICD-10-CM | POA: Diagnosis not present

## 2018-10-16 DIAGNOSIS — Z79899 Other long term (current) drug therapy: Secondary | ICD-10-CM | POA: Diagnosis not present

## 2018-10-16 DIAGNOSIS — Z7689 Persons encountering health services in other specified circumstances: Secondary | ICD-10-CM | POA: Diagnosis not present

## 2018-11-17 DIAGNOSIS — Z7689 Persons encountering health services in other specified circumstances: Secondary | ICD-10-CM | POA: Diagnosis not present

## 2018-11-17 DIAGNOSIS — E669 Obesity, unspecified: Secondary | ICD-10-CM | POA: Diagnosis not present

## 2018-12-12 DIAGNOSIS — Z1231 Encounter for screening mammogram for malignant neoplasm of breast: Secondary | ICD-10-CM | POA: Diagnosis not present

## 2018-12-22 DIAGNOSIS — E669 Obesity, unspecified: Secondary | ICD-10-CM | POA: Diagnosis not present

## 2018-12-22 DIAGNOSIS — Z7689 Persons encountering health services in other specified circumstances: Secondary | ICD-10-CM | POA: Diagnosis not present

## 2018-12-22 DIAGNOSIS — Z79899 Other long term (current) drug therapy: Secondary | ICD-10-CM | POA: Diagnosis not present

## 2018-12-22 DIAGNOSIS — M779 Enthesopathy, unspecified: Secondary | ICD-10-CM | POA: Diagnosis not present

## 2018-12-26 ENCOUNTER — Ambulatory Visit: Payer: BLUE CROSS/BLUE SHIELD | Admitting: Rehabilitative and Restorative Service Providers"

## 2018-12-27 ENCOUNTER — Ambulatory Visit (INDEPENDENT_AMBULATORY_CARE_PROVIDER_SITE_OTHER): Payer: BLUE CROSS/BLUE SHIELD | Admitting: Physical Therapy

## 2018-12-27 ENCOUNTER — Encounter: Payer: Self-pay | Admitting: Physical Therapy

## 2018-12-27 ENCOUNTER — Other Ambulatory Visit: Payer: Self-pay

## 2018-12-27 DIAGNOSIS — M25521 Pain in right elbow: Secondary | ICD-10-CM

## 2018-12-27 NOTE — Patient Instructions (Signed)
Wrist flexion and extension stretches 30 sec x3  On R Prayer (wrist ext) stretch 30 sec x3 Pec stretch , supine, with UE nerve glide (wrist flex/ext) x10;

## 2018-12-27 NOTE — Therapy (Signed)
[facility] [place] [address] [place], [place], [address] Phone: [phone]   Fax:  [phone]  Physical Therapy Evaluation  Patient Details  Name: Erika Farley MRN: [id number] Date of Birth: Jan 21, 1976 Referring Provider (PT): Kathlen Brunswick NP   Encounter Date: 12/27/2018  PT End of Session - 12/27/18 1141    Visit Number  1    Number of Visits  12    Date for PT Re-Evaluation  02/07/19    Authorization Type  BCBS    PT Start Time  1035    PT Stop Time  1115    PT Time Calculation (min)  40 min    Activity Tolerance  Patient tolerated treatment well    Behavior During Therapy  [facility] for tasks assessed/performed       Past Medical History:  Diagnosis Date  . Depression   . Endometriosis   . Infertility, female   . Migraine without aura   . PCOS (polycystic ovarian syndrome)   . Urinary incontinence     Past Surgical History:  Procedure Laterality Date  . Bone tumor in finger    . CRYOTHERAPY    . HYSTEROSCOPY    . LAPAROSCOPIC ENDOMETRIOSIS FULGURATION    . PELVIC LAPAROSCOPY      There were no vitals filed for this visit.   Subjective Assessment - 12/27/18 1037    Subjective  Pt has had increased pain in R elbow, for 4 weeks. Has not had previous pain in shoulder or elbow. Does work out at [employer], thinks that UE weight lifting has increased/started her pain. She has given arm a rest, not doing Ue weights, and has been stretching for about 2 weeks, states little improvement. She does not work at [employer]. She states singificant pain with activity, gripping, and at end of the day.      Patient Stated Goals  Decreased pain, return to workout     Currently in Pain?  Yes    Pain Score  7     Pain Location  Elbow    Pain Orientation  Right    Pain Descriptors / Indicators  Aching    Pain Type  Acute pain    Pain Onset  1 to 4 weeks ago    Pain Frequency  Intermittent    Aggravating Factors   End  of the day/ increased activity.     Pain Relieving Factors  none, rest          [facility] PT Assessment - 12/27/18 0001      Assessment   Medical Diagnosis  R lateral epicondylitis    Referring Provider (PT)  Kathlen Brunswick NP    Hand Dominance  Right      Precautions   Precautions  None      Balance Screen   Has the patient fallen in the past 6 months  No      Prior Function   Level of Independence  Independent      Cognition   Overall Cognitive Status  Within Functional Limits for tasks assessed      ROM / Strength   AROM / PROM / Strength  AROM;PROM;Strength      AROM   Overall AROM Comments  Shoulder, Elbow, Wrist, all WNL      PROM   Overall PROM Comments  Shoulder, Elbow, Wrist, all WNL      Strength   Overall Strength Comments  Shoulder: 4+/5;  Elbow: 4+/5;  Wrist: 4/5 (pain )       Palpation   Palpation comment  Soreness at lateral epicondyle and brachioradialis, pain with resisted wrist extension,       Special Tests   Other special tests  + Cozens,  Negative Mills;  Pain with gripping, Negative Tinels                Objective measurements completed on examination: See above findings.      [facility] Adult PT Treatment/Exercise - 12/27/18 0001      Exercises   Exercises  Wrist      Wrist Exercises   Other wrist exercises  Wrist flexion and extension stretches, 30 sec x2 each;  Prayer stretch 30 sec x2;       Modalities   Modalities  Iontophoresis      Iontophoresis   Type of Iontophoresis  Dexamethasone    Location  R lateral epicondyle    Time  6 hr patch      Manual Therapy   Manual Therapy  Soft tissue mobilization    Manual therapy comments  skilled palpation and monitoring of soft tissue during dry needling.     Soft tissue mobilization  DTM to lateral elbow  and brachioradialis       Trigger Point Dry Needling - 12/27/18 0001    Consent Given?  Yes    Education Handout Provided  Yes    Muscles Treated Wrist/Hand  Extensor carpi  radialis longus/brevis;Extensor carpi ulnaris    Extensor carpi radialis longus/brevis Response  Palpable increased muscle length    Extensor carpi ulnaris Response  Palpable increased muscle length           PT Education - 12/27/18 1141    Education Details  PT POC, HEP     Person(s) Educated  Patient    Methods  Explanation;Handout;Demonstration;Tactile cues;Verbal cues    Comprehension  Verbalized understanding;Need further instruction;Returned demonstration;Verbal cues required       PT Short Term Goals - 12/27/18 1201      PT SHORT TERM GOAL #1   Title  Pt to be independent with initial HEP     Time  2    Period  Weeks    Status  New    Target Date  01/10/19        PT Long Term Goals - 12/27/18 1202      PT LONG TERM GOAL #1   Title  Pt to be independent with final HEP     Time  6    Period  Weeks    Status  New    Target Date  02/07/19      PT LONG TERM GOAL #2   Title  Pt to demo decreased pain to 0-2/10 with lifting, carrying, and IADLS.     Time  6    Period  Weeks    Status  New    Target Date  02/07/19      PT LONG TERM GOAL #3   Title  Pt to demo increased strength of R UE and wrist, to be 5/5, to improve ability for lift, carry, and return to exercise.     Time  6    Period  Weeks    Status  New    Target Date  02/07/19      PT LONG TERM GOAL #4   Title  Pt to demo proper lifting techniques for UEs, to improve safety and pain with activity.  Time  6    Period  Weeks    Status  New    Target Date  02/07/19             Plan - 12/27/18 1145    Clinical Impression Statement  Pt presents with primary complaint of increased pain in R elbow, consistent with lateral epicondylitis. Pt with likley overuse from lifting and poor mechanics. Pt with good ROM of elbow, wrist, and shoulder, but has decreased strength, and increased pain with resisted testing. She has painful palpation of lateral epicondyle region, as well as brachioradialis. She  has increased pain with gripping, twisting, lifitng, carrying, ADLS, IADLs, and has been unable to continue UE workouts. Pt to benefit from skilled PT to improve deficits and return to PLOF without pain. DTM, dry needling, and Ionto performed today, will assess effects next visit.     Examination-Activity Limitations  Reach Overhead;Carry;Lift    Examination-Participation Restrictions  Meal Prep;Cleaning;Driving;Laundry    Stability/Clinical Decision Making  Stable/Uncomplicated    Clinical Decision Making  Low    Rehab Potential  Good    PT Frequency  2x / week    PT Duration  6 weeks    PT Treatment/Interventions  ADLs/Self Care Home Management;Cryotherapy;Electrical Stimulation;Iontophoresis /ml Dexamethasone;Moist Heat;Ultrasound;Neuromuscular re-education;Balance training;Therapeutic exercise;Therapeutic activities;Patient/family education;Manual techniques;Taping;Dry needling;Vasopneumatic Device;Passive range of motion;Joint Manipulations    PT Next Visit Plan  Check grip strength    Consulted and Agree with Plan of Care  Patient       Patient will benefit from skilled therapeutic intervention in order to improve the following deficits and impairments:  Increased muscle spasms, Pain, Decreased activity tolerance, Impaired flexibility, Improper body mechanics, Decreased strength, Decreased mobility, Impaired UE functional use  Visit Diagnosis: Pain in right elbow     Problem List Patient Active Problem List   Diagnosis Date Noted  . Acute midline low back pain without sciatica 01/18/2018  . Postpartum care following vaginal delivery (11/2) 08/14/2011   Sedalia Muta, PT, DPT 12:07 PM  12/27/18    [facility] [address] [place], [place], [address] Phone: [phone]   Fax:  [phone]  Name: Erika Farley MRN: [id number] Date of Birth: 06-14-1976

## 2018-12-29 ENCOUNTER — Telehealth: Payer: Self-pay | Admitting: Rehabilitative and Restorative Service Providers"

## 2018-12-29 NOTE — Telephone Encounter (Signed)
Contacted patient - left message informing patient that all [facility] outpatient clinics will be closed until January 15, 2019 due to COVID 19 precautions. Patient was ask to call with any questions and/or to schedule additional appointments.   Dorleen Kissel P. Leonor Liv PT, MPH 12/29/18 10:09 AM

## 2019-01-01 ENCOUNTER — Encounter: Payer: Self-pay | Admitting: Physical Therapy

## 2019-01-04 ENCOUNTER — Encounter: Payer: Self-pay | Admitting: Rehabilitative and Restorative Service Providers"

## 2019-03-09 DIAGNOSIS — M25521 Pain in right elbow: Secondary | ICD-10-CM | POA: Diagnosis not present

## 2019-03-20 DIAGNOSIS — F411 Generalized anxiety disorder: Secondary | ICD-10-CM | POA: Diagnosis not present

## 2019-05-24 ENCOUNTER — Ambulatory Visit: Payer: BLUE CROSS/BLUE SHIELD | Admitting: Podiatry

## 2019-07-17 ENCOUNTER — Other Ambulatory Visit: Payer: Self-pay

## 2019-07-18 ENCOUNTER — Ambulatory Visit: Payer: BC Managed Care – PPO | Admitting: Obstetrics and Gynecology

## 2019-07-18 ENCOUNTER — Encounter: Payer: Self-pay | Admitting: Obstetrics and Gynecology

## 2019-07-18 NOTE — Progress Notes (Deleted)
43 y.o. [id number] Married White or Caucasian Not Hispanic or Latino female here for annual exam.      No LMP recorded.          Sexually active: {yes no:314532}  The current method of family planning is {contraception:315051}.    Exercising: {yes no:314532}  {types:19826} Smoker:  {YES [id number]  Health Maintenance: Pap:  10/21/2016 WNL NEG HPV, 05-06-15 WNL History of abnormal Pap:  Yes years ago, s/p cryosurgery MMG:  12/07/2017 Birads 2 benign Colonoscopy:  Never BMD:   Never TDaP:  08-14-2011 Gardasil: N/A   reports that she has never smoked. She has never used smokeless tobacco. She reports that she does not drink alcohol or use drugs.  Past Medical History:  Diagnosis Date  . Depression   . Endometriosis   . Infertility, female   . Migraine without aura   . PCOS (polycystic ovarian syndrome)   . Urinary incontinence     Past Surgical History:  Procedure Laterality Date  . Bone tumor in finger    . CRYOTHERAPY    . HYSTEROSCOPY    . LAPAROSCOPIC ENDOMETRIOSIS FULGURATION    . PELVIC LAPAROSCOPY      Current Outpatient Medications  Medication Sig Dispense Refill  . buPROPion (WELLBUTRIN XL) 300 MG 24 hr tablet Take 1 tablet (300 mg total) by mouth daily. 90 tablet 3  . Multiple Vitamin (MULTIVITAMIN) capsule Take 1 capsule by mouth daily.     Current Facility-Administered Medications  Medication Dose Route Frequency Provider Last Rate Last Dose  . triamcinolone acetonide (KENALOG) 10 MG/ML injection 10 mg  10 mg Other Once Wallene Huh, DPM        Family History  Problem Relation Age of Onset  . Malignant hyperthermia Mother   . Diabetes Mother   . Malignant hyperthermia Maternal Aunt     Review of Systems  Exam:   There were no vitals taken for this visit.  Weight change: @WEIGHTCHANGE @ Height:      Ht Readings from Last 3 Encounters:  01/18/18 5\' 3"  (1.6 m)  10/21/16 5' 3.5" (1.613 m)  08/13/11 5\' 3"  (1.6 m)    General appearance: alert,  cooperative and appears stated age Head: Normocephalic, without obvious abnormality, atraumatic Neck: no adenopathy, supple, symmetrical, trachea midline and thyroid {CHL AMB PHY EX THYROID NORM DEFAULT:[phone]::"normal to inspection and palpation"} Lungs: clear to auscultation bilaterally Cardiovascular: regular rate and rhythm Breasts: {Exam; breast:13139::"normal appearance, no masses or tenderness"} Abdomen: soft, non-tender; non distended,  no masses,  no organomegaly Extremities: extremities normal, atraumatic, no cyanosis or edema Skin: Skin color, texture, turgor normal. No rashes or lesions Lymph nodes: Cervical, supraclavicular, and axillary nodes normal. No abnormal inguinal nodes palpated Neurologic: Grossly normal   Pelvic: External genitalia:  no lesions              Urethra:  normal appearing urethra with no masses, tenderness or lesions              Bartholins and Skenes: normal                 Vagina: normal appearing vagina with normal color and discharge, no lesions              Cervix: {CHL AMB PHY EX CERVIX NORM DEFAULT:[id number]::"no lesions"}               Bimanual Exam:  Uterus:  {CHL AMB PHY EX UTERUS NORM DEFAULT:[phone]::"normal size, contour, position, consistency, mobility, non-tender"}  Adnexa: {CHL AMB PHY EX ADNEXA NO MASS DEFAULT:[phone]::"no mass, fullness, tenderness"}               Rectovaginal: Confirms               Anus:  normal sphincter tone, no lesions  Chaperone was present for exam.  A:  Well Woman with normal exam  P:

## 2019-08-22 ENCOUNTER — Ambulatory Visit: Payer: BC Managed Care – PPO | Admitting: Obstetrics & Gynecology

## 2019-10-03 ENCOUNTER — Ambulatory Visit: Payer: BC Managed Care – PPO | Attending: Internal Medicine

## 2019-10-03 DIAGNOSIS — Z20828 Contact with and (suspected) exposure to other viral communicable diseases: Secondary | ICD-10-CM | POA: Diagnosis not present

## 2019-10-03 DIAGNOSIS — Z20822 Contact with and (suspected) exposure to covid-19: Secondary | ICD-10-CM

## 2019-10-05 LAB — NOVEL CORONAVIRUS, NAA: SARS-CoV-2, NAA: NOT DETECTED

## 2020-07-01 ENCOUNTER — Other Ambulatory Visit: Payer: Self-pay | Admitting: Plastic Surgery

## 2020-09-01 ENCOUNTER — Telehealth (HOSPITAL_COMMUNITY): Payer: Self-pay | Admitting: Family Medicine

## 2020-09-01 MED ORDER — PREDNISONE 10 MG (21) PO TBPK: ORAL_TABLET | ORAL | 0 refills | Status: DC

## 2020-09-01 NOTE — Telephone Encounter (Signed)
Treating sciatic nerve flare up.

## 2020-10-22 ENCOUNTER — Other Ambulatory Visit: Payer: Self-pay | Admitting: Orthopedic Surgery

## 2020-10-22 DIAGNOSIS — M5416 Radiculopathy, lumbar region: Secondary | ICD-10-CM

## 2020-11-08 ENCOUNTER — Other Ambulatory Visit: Payer: BC Managed Care – PPO

## 2020-11-09 ENCOUNTER — Other Ambulatory Visit: Payer: Self-pay

## 2020-11-09 ENCOUNTER — Ambulatory Visit
Admission: RE | Admit: 2020-11-09 | Discharge: 2020-11-09 | Disposition: A | Discharge status: Home / Self Care | Payer: BC Managed Care – PPO | Source: Ambulatory Visit | Attending: Orthopedic Surgery | Admitting: Orthopedic Surgery

## 2020-11-09 DIAGNOSIS — M5416 Radiculopathy, lumbar region: Secondary | ICD-10-CM

## 2020-11-09 IMAGING — MR MR LUMBAR SPINE W/O CM
4 of 5 series · 26 of 48 positions shown · non-contrast
Comparison: Lumbar radiographs [DATE]

CLINICAL DATA: Low back pain.  Left hip and buttock pain.

EXAM:
MRI LUMBAR SPINE WITHOUT CONTRAST
TECHNIQUE: Multiplanar, multisequence MR imaging of the lumbar spine was
performed. No intravenous contrast was administered.

[Series 3: T2 post-contrast · sagittal · 4.0mm · 0.53mm/px · 6 of 16 slices shown]
[im 1/16]
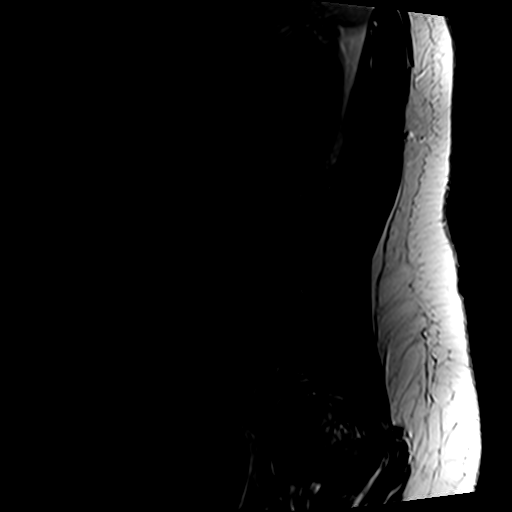
[im 4/16]
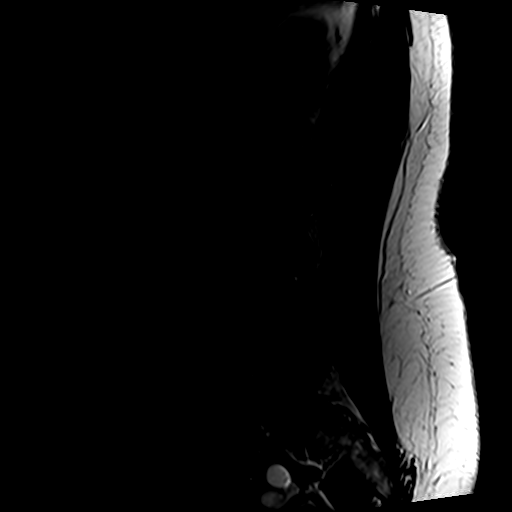
[im 7/16]
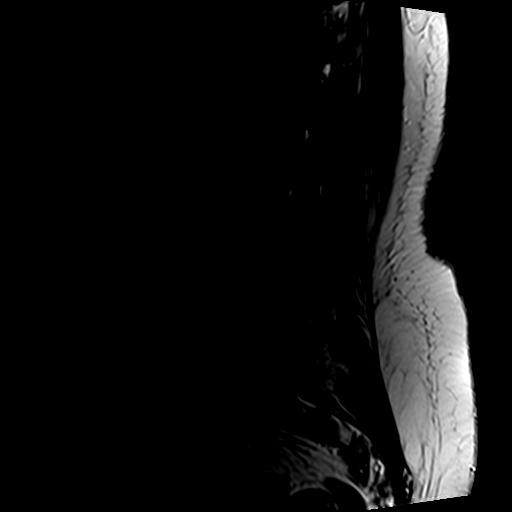
[im 10/16]
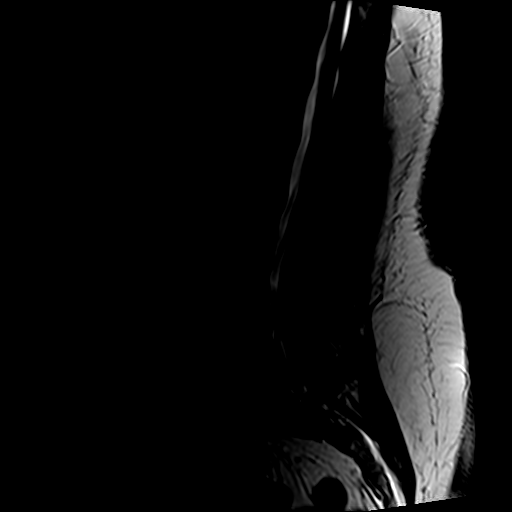
[im 13/16]
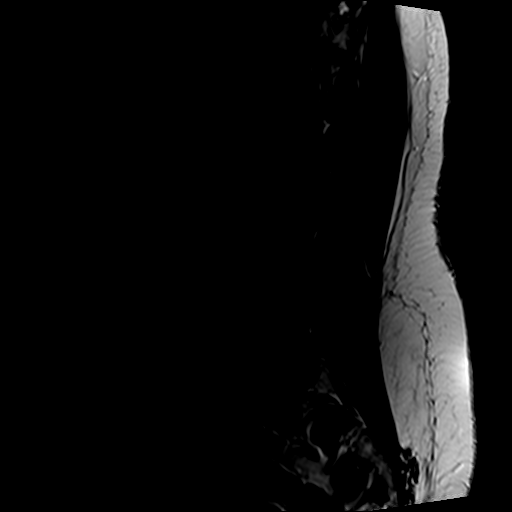
[im 16/16]
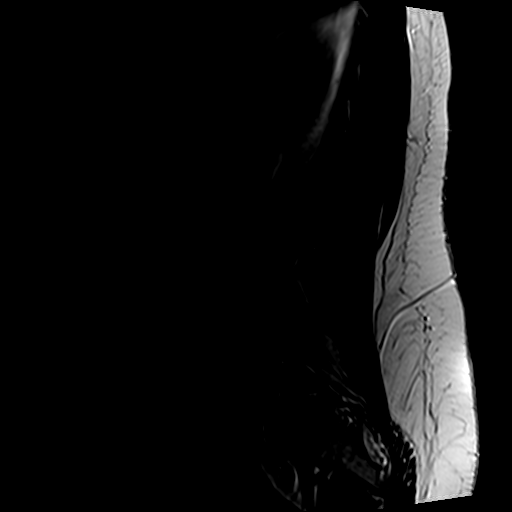

[Series 5: T1 · sagittal · 4.0mm · 0.53mm/px · 6 of 16 slices shown (1 of 2)]
[im 1/16]
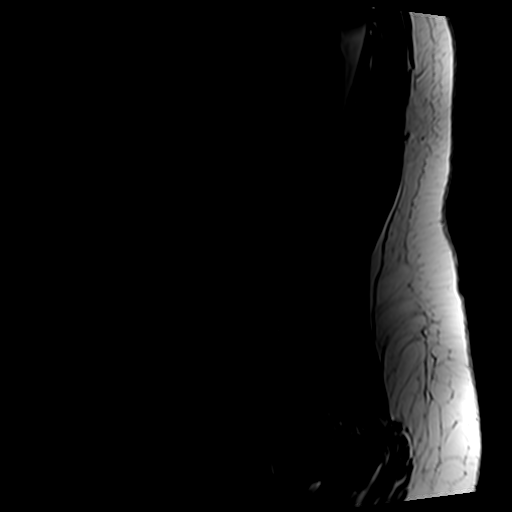
[im 4/16]
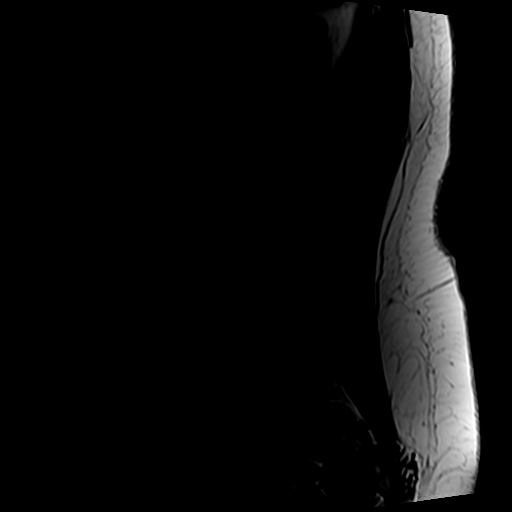
[im 7/16]
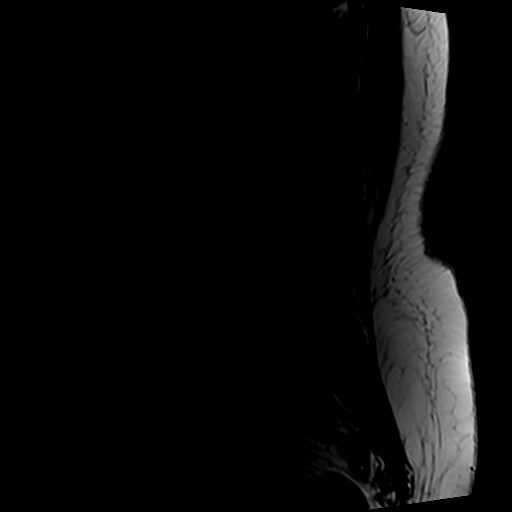
[im 10/16]
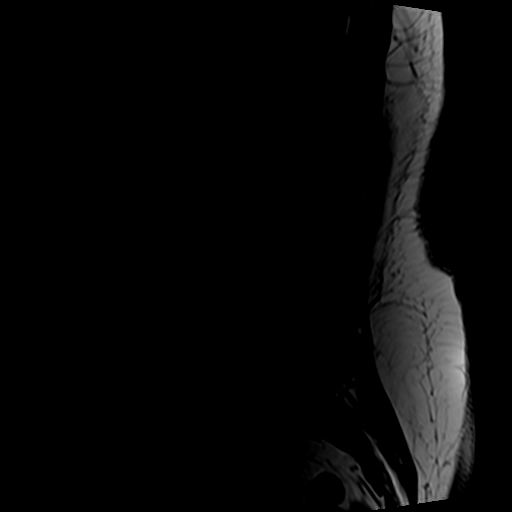
[im 13/16]
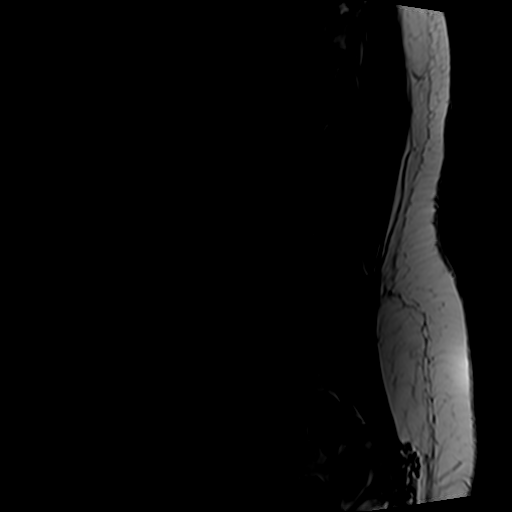
[im 16/16]
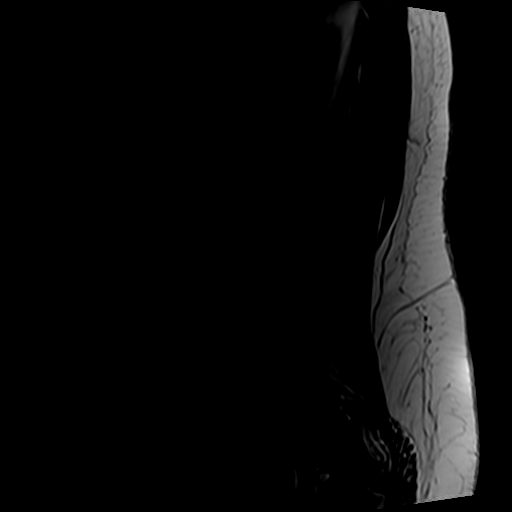

[Series 6: T2 · axial · 4.0mm · 0.70mm/px · z∈[-135,+77]mm · 9 of 38 slices shown]
[im 1/38]
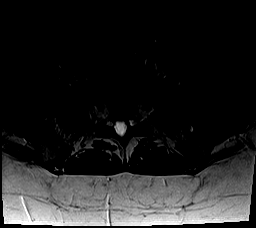
[im 6/38]
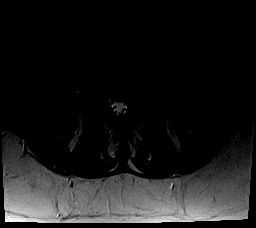
[im 11/38]
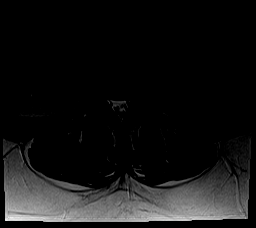
[im 16/38]
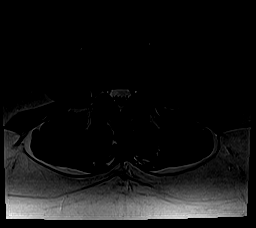
[im 19/38]
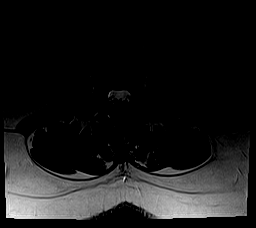
[im 22/38]
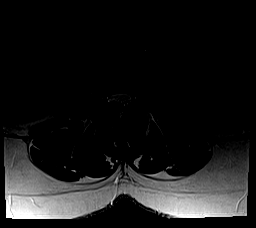
[im 27/38]
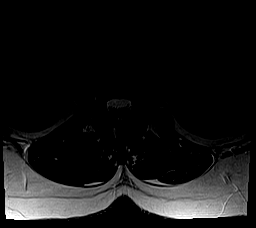
[im 32/38]
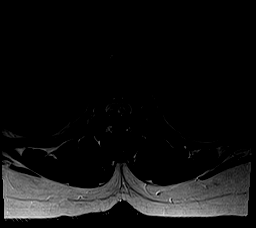
[im 38/38]
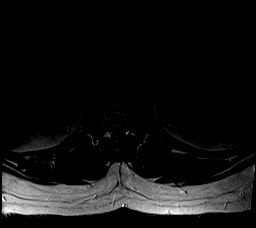

[Series 7: T1 · axial · 4.0mm · 0.35mm/px · z∈[-135,+46]mm · 5 of 38 slices shown (2 of 2)]
[im 1/38]
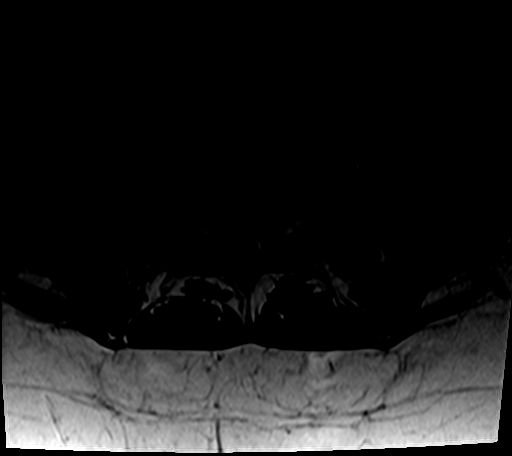
[im 6/38]
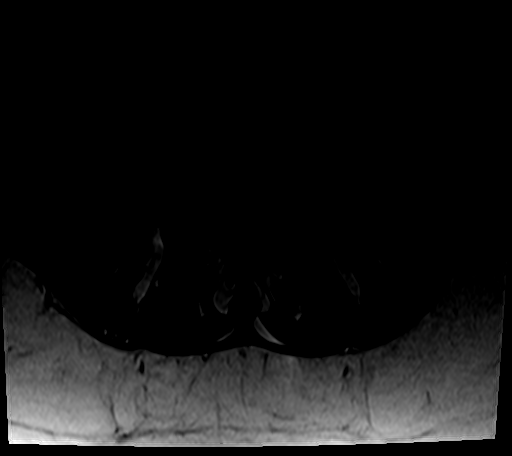
[im 11/38]
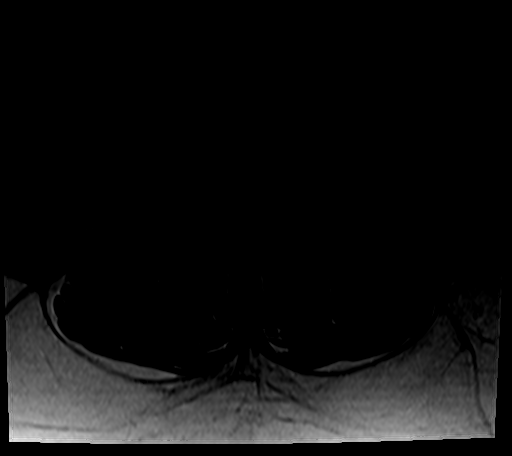
[im 19/38]
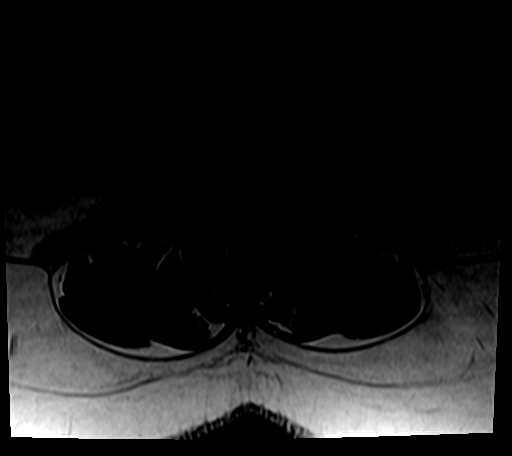
[im 32/38]
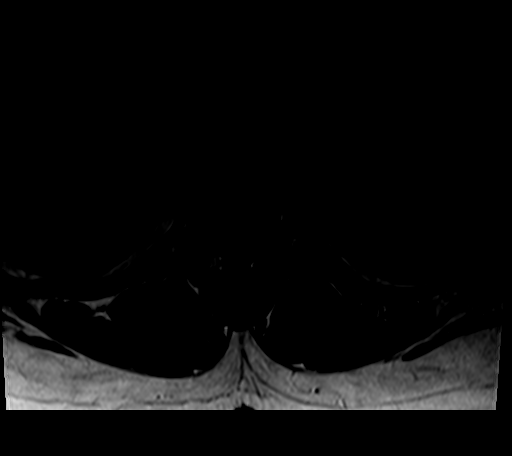

[26 of 48 positions shown; findings below may reference images not displayed]

FINDINGS: Segmentation:  Normal

Alignment:  Normal

Vertebrae: Negative for fracture or mass. Large hemangioma L3
vertebral body on the left.

Conus medullaris and cauda equina: Conus extends to the L1-2 level.
Conus and cauda equina appear normal.

Paraspinal and other soft tissues: Negative for paraspinous mass or
adenopathy.

Disc levels:

L1-2: Negative

L2-3: Negative

L3-4: Negative

L4-5: Mild disc degeneration with shallow central disc protrusion.
Bilateral facet hypertrophy. Small synovial cyst projecting
posterior to the right L4-5 facet. Mild spinal stenosis. Mild to
moderate subarticular stenosis bilaterally which could affect the
descending L5 nerve root left greater than right

L5-S1: Central disc protrusion. Bilateral facet hypertrophy.
Bilateral S1 nerve root impingement.
IMPRESSION: Mild spinal stenosis L4-5. Mild to moderate subarticular stenosis
bilaterally which could affect the descending L5 nerve root
bilaterally left greater than right

Central disc protrusion L5-S1 with bilateral S1 nerve root
impingement.

## 2020-11-17 ENCOUNTER — Other Ambulatory Visit: Payer: Self-pay | Admitting: Orthopedic Surgery

## 2020-11-17 DIAGNOSIS — M5416 Radiculopathy, lumbar region: Secondary | ICD-10-CM

## 2020-11-18 ENCOUNTER — Other Ambulatory Visit: Payer: Self-pay

## 2020-11-18 ENCOUNTER — Ambulatory Visit
Admission: RE | Admit: 2020-11-18 | Discharge: 2020-11-18 | Disposition: A | Discharge status: Home / Self Care | Payer: BC Managed Care – PPO | Source: Ambulatory Visit | Attending: Orthopedic Surgery | Admitting: Orthopedic Surgery

## 2020-11-18 ENCOUNTER — Other Ambulatory Visit: Payer: Self-pay | Admitting: Orthopedic Surgery

## 2020-11-18 DIAGNOSIS — M5416 Radiculopathy, lumbar region: Secondary | ICD-10-CM

## 2020-11-18 IMAGING — XA DG EPIDURAL NERVE ROOT
4 series · 4 of 4 positions shown · non-contrast
Comparison: none

CLINICAL DATA: Lumbosacral spondylosis without myelopathy. Left
buttock and left leg pain. Specific request for left L5 and left S1
nerve root blocks.

[Series 1: ortho adipose · 1 of 1 slices shown (1 of 4)]
[im 1/1]
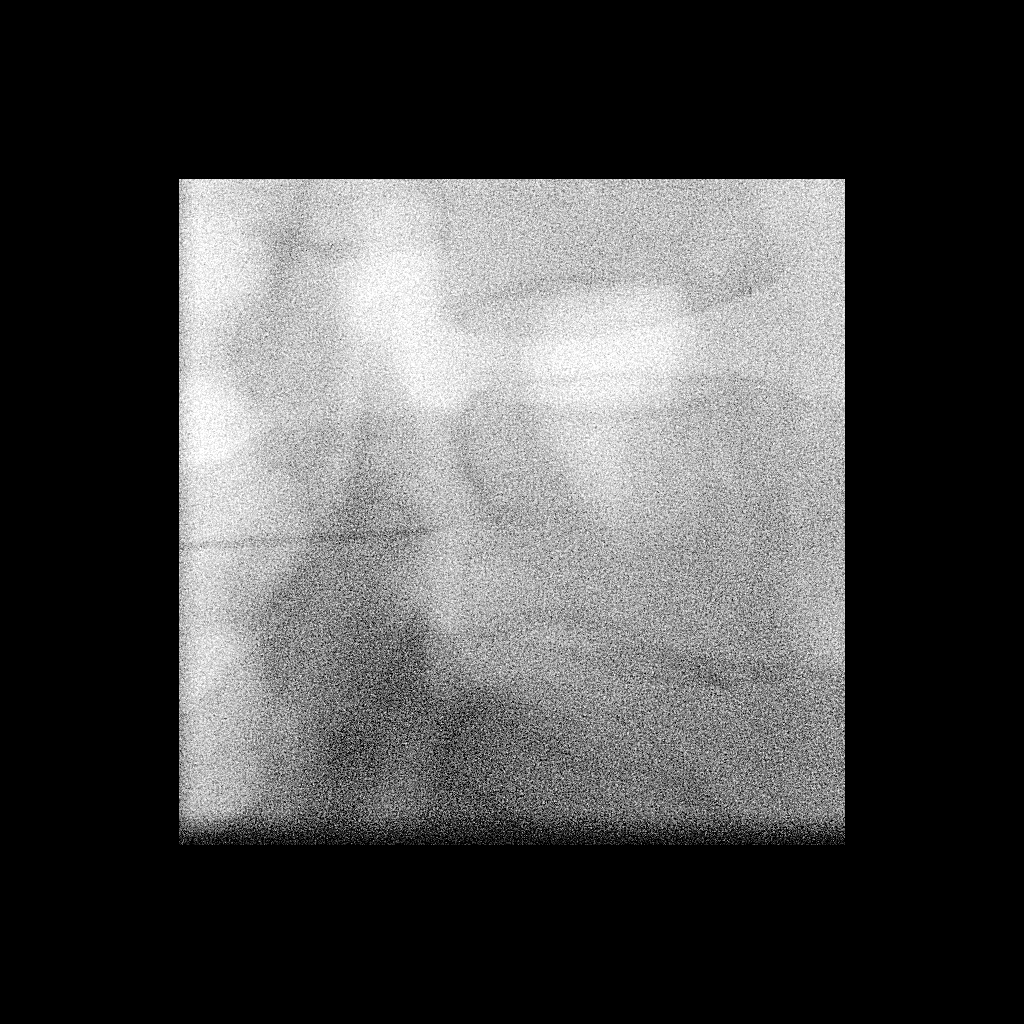

[Series 2: ortho adipose · 1 of 1 slices shown (2 of 4)]
[im 1/1]
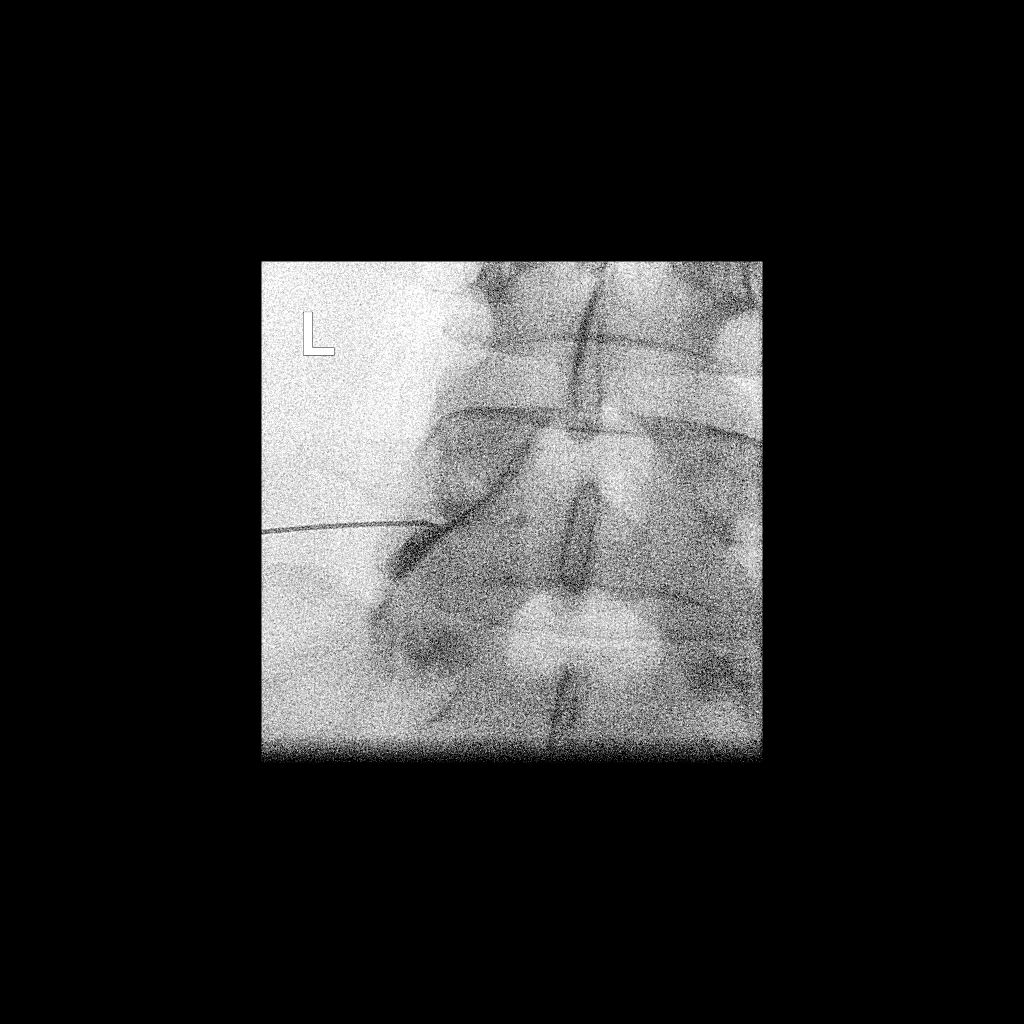

[Series 3: ortho adipose · 1 of 1 slices shown (3 of 4)]
[im 1/1]
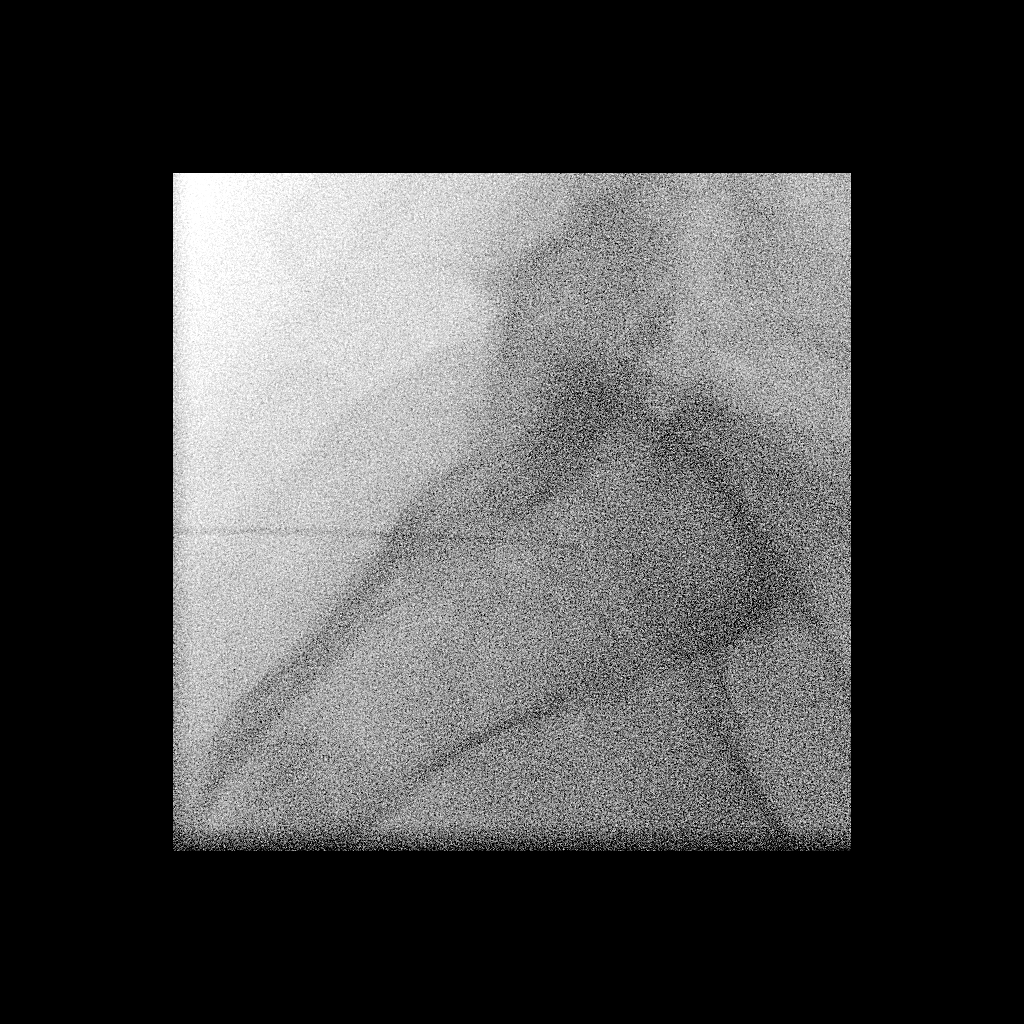

[Series 4: ortho adipose · 1 of 1 slices shown (4 of 4)]
[im 1/1]
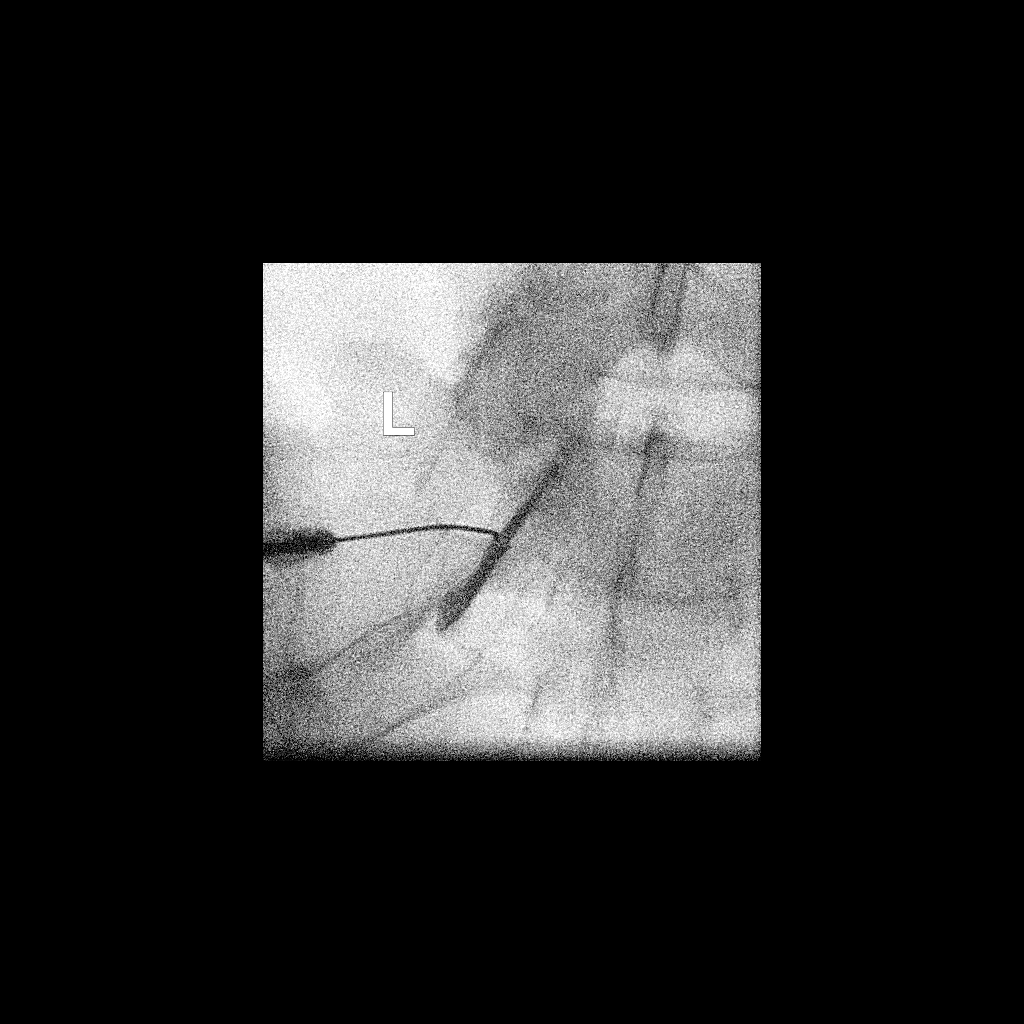

[4 of 4 positions shown; findings below may reference images not displayed]

EXAM:
EPIDURAL/NERVE ROOT; L/S NERVE ROOT BLOCK EACH ADDITIONAL

FLUOROSCOPY TIME:  Fluoroscopy Time: 46 seconds

Radiation Exposure Index: 53.44 microGray*m^2

PROCEDURE:
The procedure, risks, benefits, and alternatives were explained to
the patient. Questions regarding the procedure were encouraged and
answered. The patient understands and consents to the procedure.

LEFT L5 NERVE ROOT BLOCK AND TRANSFORAMINAL EPIDURAL: A posterior
oblique approach was taken to the intervertebral foramen on the left
at L5-S1 using a curved 5 inch 22 gauge spinal needle. Injection of
Isovue-M 200 outlined the left L5 nerve root and showed good
epidural spread. No vascular opacification is seen. 60 mg of
Depo-Medrol mixed with 1.5 mL of 1% lidocaine were instilled. The
injection resulted in concordant pain.

LEFT S1 NERVE ROOT BLOCK AND TRANSFORAMINAL EPIDURAL: A posterior
oblique approach was taken to the intervertebral foramen on the left
at the S1 level using a curved 3.5 inch 22 gauge spinal needle.
Injection of Isovue-M 200 outlined the left S1 nerve root and showed
good epidural spread. No vascular opacification is seen. 60 mg of
Depo-Medrol mixed with 1.5 mL of 1% lidocaine were instilled.

The procedure was well-tolerated, and the patient was discharged
thirty minutes following the injection in good condition.

COMPLICATIONS:
None
IMPRESSION: Technically successful injections consisting of left L5 and left S1
nerve root blocks and transforaminal epidurals.

## 2020-11-18 MED ORDER — METHYLPREDNISOLONE ACETATE 40 MG/ML INJ SUSP (RADIOLOG
120.0000 mg | Freq: Once | INTRAMUSCULAR | Status: AC
  Administered 2020-11-18: 120 mg via EPIDURAL

## 2020-11-18 MED ORDER — METHYLPREDNISOLONE ACETATE 40 MG/ML INJ SUSP (RADIOLOG: 120.0000 mg | Freq: Once | INTRAMUSCULAR | Status: DC

## 2020-11-18 MED ORDER — IOPAMIDOL (ISOVUE-M 200) INJECTION 41%
1.0000 mL | Freq: Once | INTRAMUSCULAR | Status: AC
  Administered 2020-11-18: 1 mL via EPIDURAL

## 2020-11-18 MED ORDER — IOPAMIDOL (ISOVUE-M 200) INJECTION 41%: 1.0000 mL | Freq: Once | INTRAMUSCULAR | Status: DC

## 2020-11-18 NOTE — Discharge Instructions (Signed)

## 2020-12-12 ENCOUNTER — Other Ambulatory Visit: Payer: Self-pay | Admitting: Orthopedic Surgery

## 2020-12-12 DIAGNOSIS — M5416 Radiculopathy, lumbar region: Secondary | ICD-10-CM

## 2020-12-17 ENCOUNTER — Other Ambulatory Visit: Payer: BC Managed Care – PPO

## 2020-12-19 ENCOUNTER — Other Ambulatory Visit: Payer: Self-pay

## 2020-12-19 ENCOUNTER — Ambulatory Visit
Admission: RE | Admit: 2020-12-19 | Discharge: 2020-12-19 | Disposition: A | Discharge status: Home / Self Care | Payer: BC Managed Care – PPO | Source: Ambulatory Visit | Attending: Orthopedic Surgery | Admitting: Orthopedic Surgery

## 2020-12-19 DIAGNOSIS — M5416 Radiculopathy, lumbar region: Secondary | ICD-10-CM

## 2020-12-19 IMAGING — XA DG EPIDURAL NERVE ROOT
2 series · 2 of 2 positions shown · non-contrast
Comparison: none

CLINICAL DATA: Lower extremity radiculopathy. Displacement of the
L4-5 and L5-S1 lumbar discs.

[Series 1: ortho adipose · 1 of 1 slices shown (1 of 2)]
[im 1/1]
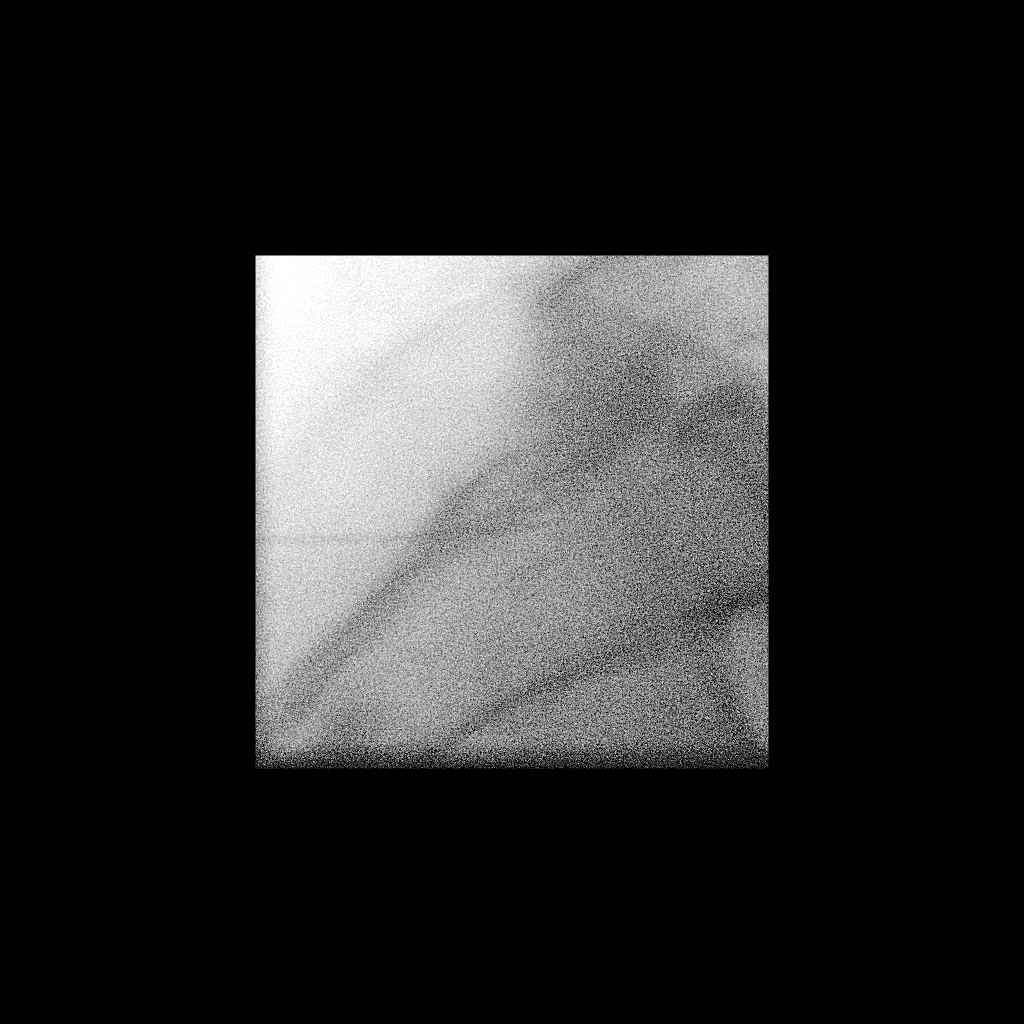

[Series 3: ortho adipose · 1 of 1 slices shown (2 of 2)]
[im 1/1]
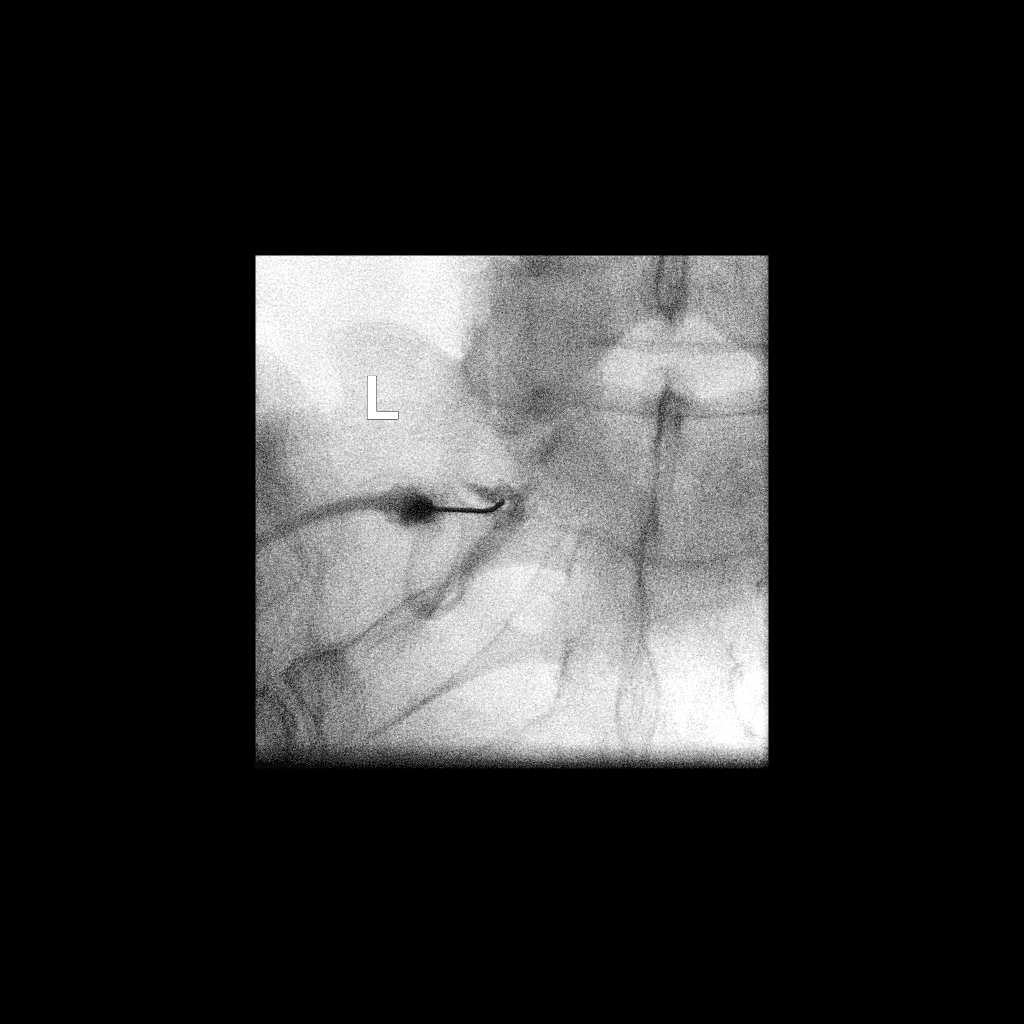

[2 of 2 positions shown; findings below may reference images not displayed]

FLUOROSCOPY TIME:  Radiation Exposure Index (as provided by the
fluoroscopic device): 23.31 uGy*m2

PROCEDURE:
SELECTIVE NERVE ROOT AND TRANSFORAMINAL EPIDURAL STEROID INJECTION:

A transforaminal approach was performed on the left at the S1
foramen. The overlying skin was cleansed and anesthetized. A 22
gauge spinal needle was advanced into the foramen. Lateral injection
confirmed needle placement. Injection of 2cc of Isovue-M 200
outlined the nerve root and extended into the epidural space. No
vascular or intrathecal spread is evident.

I then injected 120 mg of Depo-Medrol and 1.5 ml of 1% lidocaine.
The patient tolerated the procedure without evidence for
complication.

The patient was observed for 20 minutes prior to discharge in stable
neurologic condition.
IMPRESSION: Technically successful selective nerve root block and transforaminal
epidural steroid injection on theleft at the S1 foramen.

## 2020-12-19 MED ORDER — TRIAMCINOLONE ACETONIDE 40 MG/ML IJ SUSP (RADIOLOGY)
60.0000 mg | Freq: Once | INTRAMUSCULAR | Status: AC
  Administered 2020-12-19: 60 mg via EPIDURAL

## 2020-12-19 MED ORDER — IOPAMIDOL (ISOVUE-M 300) INJECTION 61%
1.0000 mL | Freq: Once | INTRAMUSCULAR | Status: AC
  Administered 2020-12-19: 1 mL via EPIDURAL

## 2020-12-19 NOTE — Discharge Instructions (Signed)

## 2021-02-09 ENCOUNTER — Other Ambulatory Visit: Payer: Self-pay | Admitting: Orthopedic Surgery

## 2021-02-09 DIAGNOSIS — M545 Low back pain, unspecified: Secondary | ICD-10-CM

## 2021-02-09 DIAGNOSIS — G8929 Other chronic pain: Secondary | ICD-10-CM

## 2021-02-27 ENCOUNTER — Ambulatory Visit
Admission: RE | Admit: 2021-02-27 | Discharge: 2021-02-27 | Disposition: A | Discharge status: Home / Self Care | Payer: BC Managed Care – PPO | Source: Ambulatory Visit | Attending: Orthopedic Surgery | Admitting: Orthopedic Surgery

## 2021-02-27 ENCOUNTER — Other Ambulatory Visit: Payer: Self-pay | Admitting: Orthopedic Surgery

## 2021-02-27 ENCOUNTER — Other Ambulatory Visit: Payer: Self-pay

## 2021-02-27 DIAGNOSIS — G8929 Other chronic pain: Secondary | ICD-10-CM

## 2021-02-27 DIAGNOSIS — M545 Low back pain, unspecified: Secondary | ICD-10-CM

## 2021-02-27 IMAGING — XA DG EPIDURAL NERVE ROOT
2 series · 2 of 2 positions shown · non-contrast
Comparison: none

CLINICAL DATA: Lumbosacral spondylosis without myelopathy. Left
buttock and leg pain. Improvement after left L5 and S1 nerve root
blocks in [REDACTED]. No improvement after left S1 nerve root block in
[REDACTED]. Left L5 nerve root block requested today.

[Series 1: ortho adipose · 1 of 1 slices shown (1 of 2)]
[im 1/1]
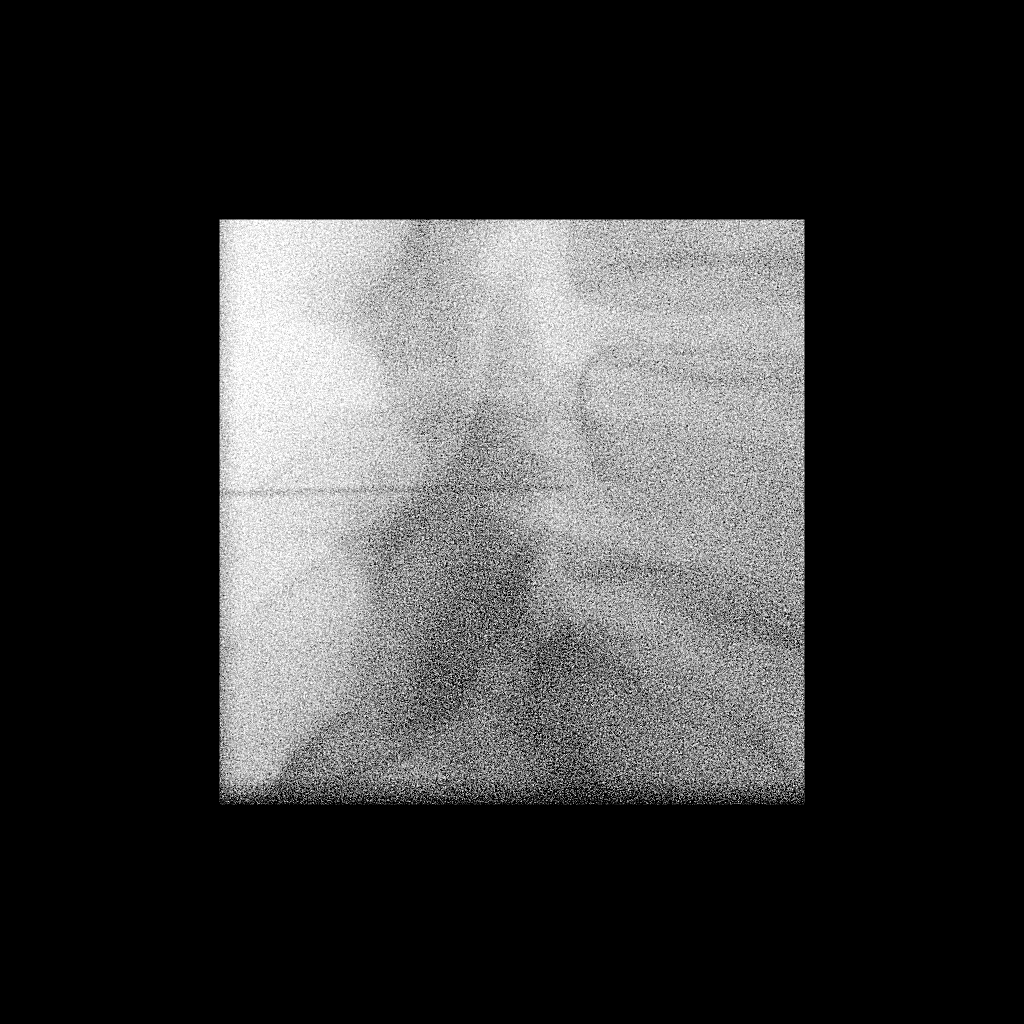

[Series 2: ortho adipose · 1 of 1 slices shown (2 of 2)]
[im 1/1]
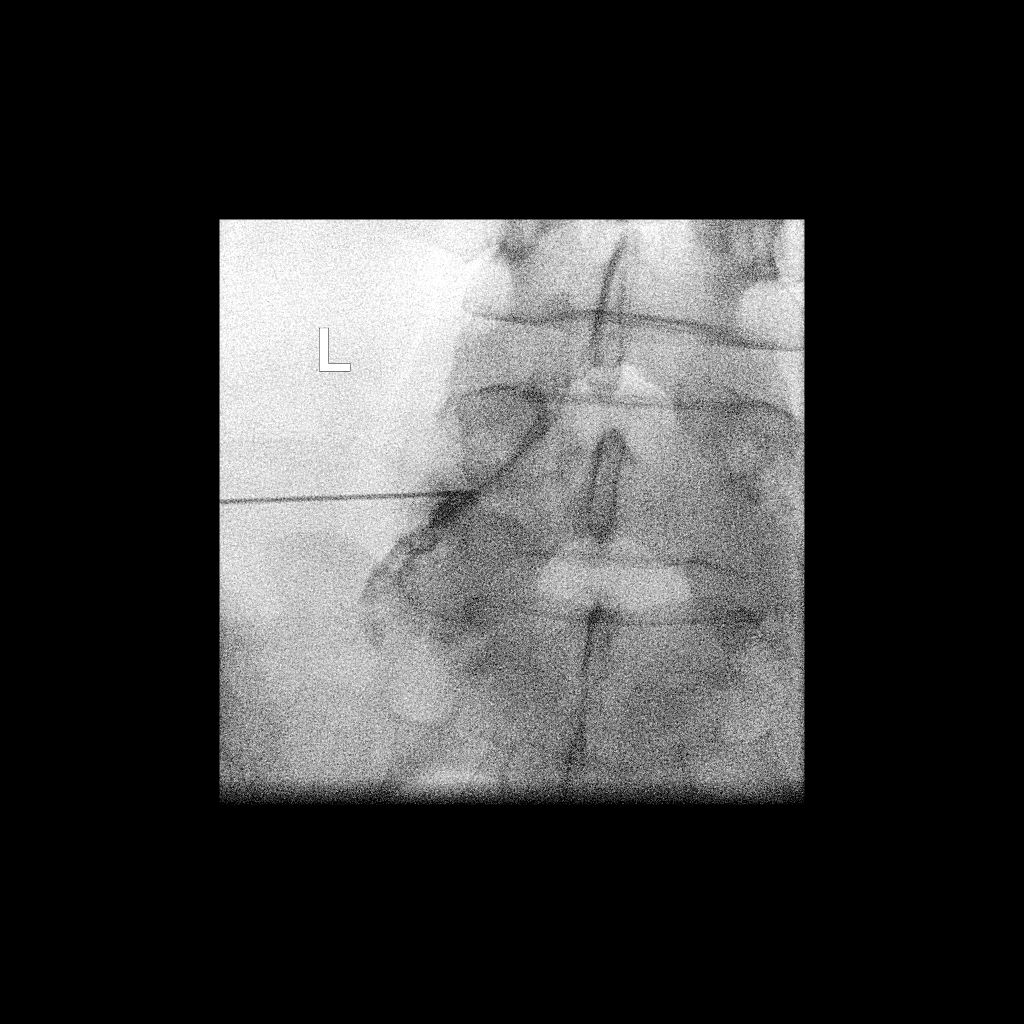

[2 of 2 positions shown; findings below may reference images not displayed]

EXAM:
EPIDURAL/NERVE ROOT

FLUOROSCOPY TIME:  Radiation Exposure Index (as provided by the
fluoroscopic device): 4.7 mGy

Fluoroscopy Time:  27 seconds

Number of Acquired Images:  0

PROCEDURE:
The procedure, risks, benefits, and alternatives were explained to
the patient. Questions regarding the procedure were encouraged and
answered. The patient understands and consents to the procedure.

LEFT L5 NERVE ROOT BLOCK AND TRANSFORAMINAL EPIDURAL: A posterior
oblique approach was taken to the intervertebral foramen on the left
at L5-S1 using a curved 5 inch 22 gauge spinal needle. Injection of
Isovue M 200 outlined the left L5 nerve root and showed good
epidural spread. No vascular opacification is seen. 80 mg of
Depo-Medrol mixed with 2 mL of 1% lidocaine were instilled. The
injection resulted in concordant pain. The procedure was
well-tolerated, and the patient was discharged thirty minutes
following the injection in good condition.

COMPLICATIONS:
None immediate.
IMPRESSION: Technically successful injection consisting of a left L5 nerve root
block and transforaminal epidural.

## 2021-02-27 MED ORDER — IOPAMIDOL (ISOVUE-M 200) INJECTION 41%
1.0000 mL | Freq: Once | INTRAMUSCULAR | Status: AC
  Administered 2021-02-27: 1 mL via EPIDURAL

## 2021-02-27 MED ORDER — METHYLPREDNISOLONE ACETATE 40 MG/ML INJ SUSP (RADIOLOG
80.0000 mg | Freq: Once | INTRAMUSCULAR | Status: AC
  Administered 2021-02-27: 80 mg via EPIDURAL

## 2021-02-27 NOTE — Discharge Instructions (Signed)
Post Procedure Spinal Discharge Instruction Sheet  1. You may resume a regular diet and any medications that you routinely take (including pain medications).  2. No driving day of procedure.  3. Light activity throughout the rest of the day.  Do not do any strenuous work, exercise, bending or lifting.  The day following the procedure, you can resume normal physical activity but you should refrain from exercising or physical therapy for at least three days thereafter.   Common Side Effects:   Headaches- take your usual medications as directed by your physician.  Increase your fluid intake.  Caffeinated beverages may be helpful.  Lie flat in bed until your headache resolves.   Restlessness or inability to sleep- you may have trouble sleeping for the next few days.  Ask your referring physician if you need any medication for sleep.   Facial flushing or redness- should subside within a few days.   Increased pain- a temporary increase in pain a day or two following your procedure is not unusual.  Take your pain medication as prescribed by your referring physician.   Leg cramps  Please contact our office at [phone] for the following symptoms:  Fever greater than 100 degrees.  Headaches unresolved with medication after 2-3 days.  Increased swelling, pain, or redness at injection site.   Thank you for visiting [facility] today.  

## 2021-03-10 ENCOUNTER — Other Ambulatory Visit: Payer: Self-pay | Admitting: Orthopedic Surgery

## 2021-03-10 DIAGNOSIS — M5416 Radiculopathy, lumbar region: Secondary | ICD-10-CM

## 2021-03-11 ENCOUNTER — Ambulatory Visit
Admission: RE | Admit: 2021-03-11 | Discharge: 2021-03-11 | Disposition: A | Discharge status: Home / Self Care | Payer: BC Managed Care – PPO | Source: Ambulatory Visit | Attending: Orthopedic Surgery | Admitting: Orthopedic Surgery

## 2021-03-11 ENCOUNTER — Other Ambulatory Visit: Payer: Self-pay

## 2021-03-11 DIAGNOSIS — M5416 Radiculopathy, lumbar region: Secondary | ICD-10-CM

## 2021-03-11 IMAGING — MR MR LUMBAR SPINE W/O CM
4 of 5 series · 25 of 48 positions shown · non-contrast
Comparison: [DATE]

CLINICAL DATA: Pain and numbness of the left leg over the last 7
months.

EXAM:
MRI LUMBAR SPINE WITHOUT CONTRAST
TECHNIQUE: Multiplanar, multisequence MR imaging of the lumbar spine was
performed. No intravenous contrast was administered.

[Series 3: T2 · sagittal · 4.0mm · 0.53mm/px · 7 of 16 slices shown (1 of 2)]
[im 1/16]
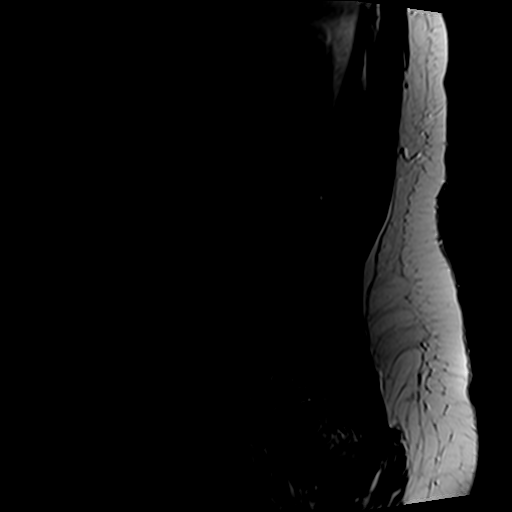
[im 3/16]
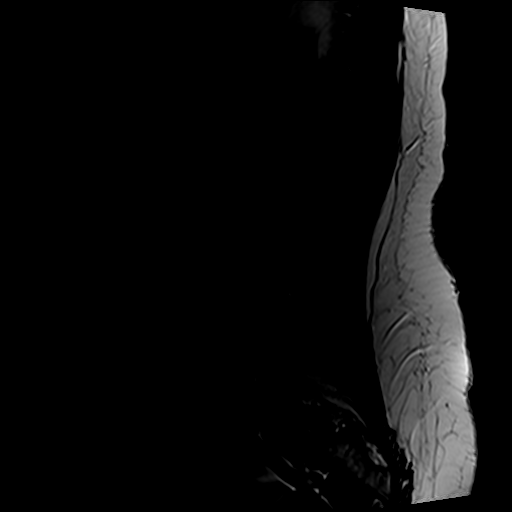
[im 6/16]
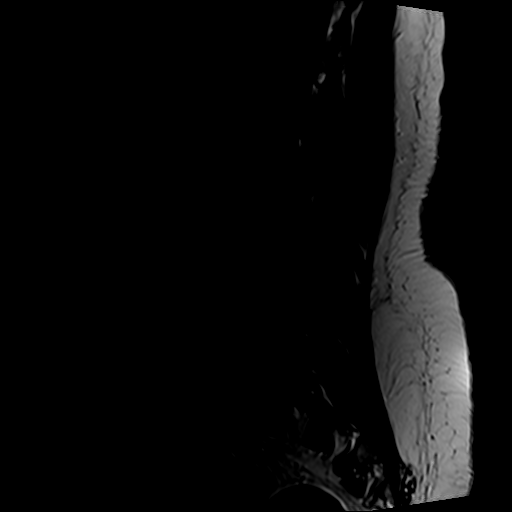
[im 8/16]
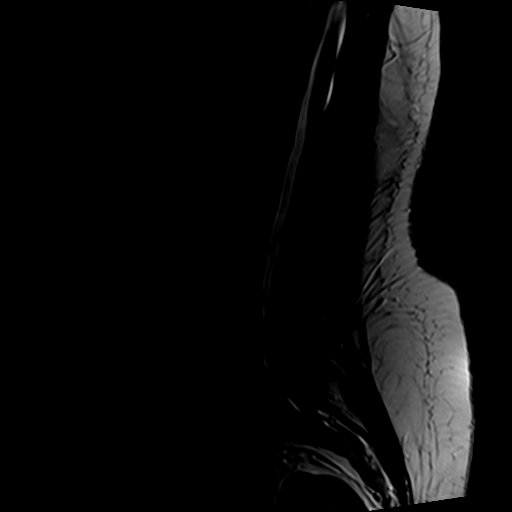
[im 11/16]
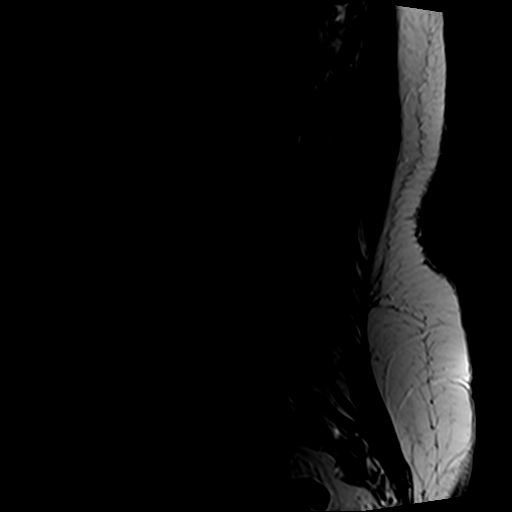
[im 13/16]
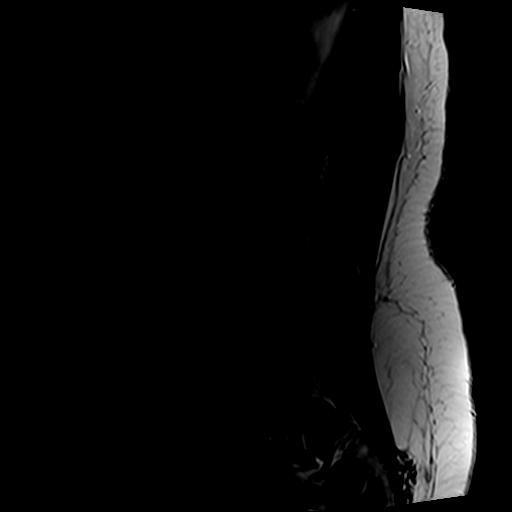
[im 16/16]
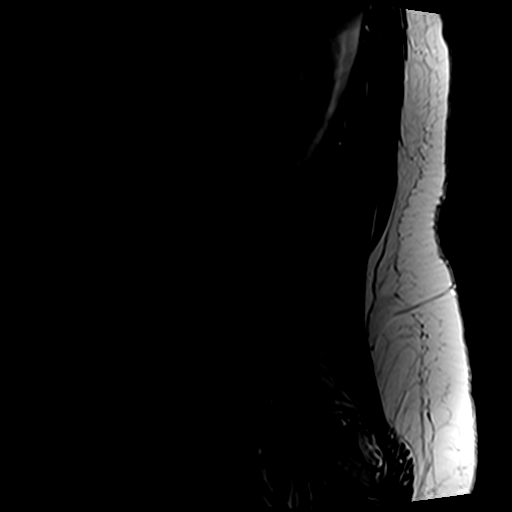

[Series 5: T1 · sagittal · 4.0mm · 0.53mm/px · 6 of 16 slices shown (1 of 2)]
[im 1/16]
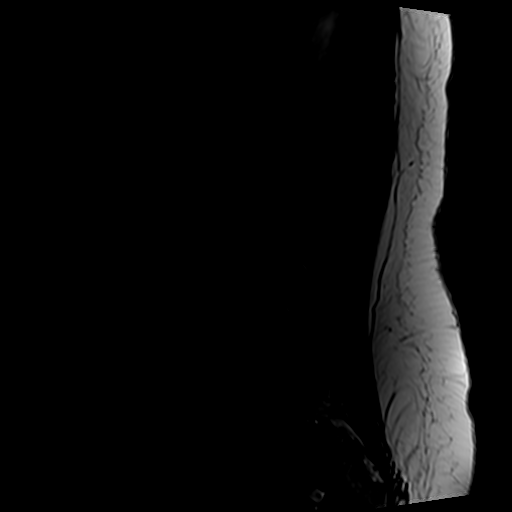
[im 4/16]
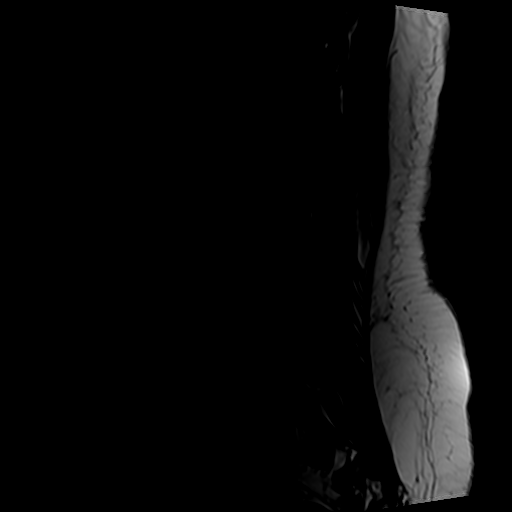
[im 7/16]
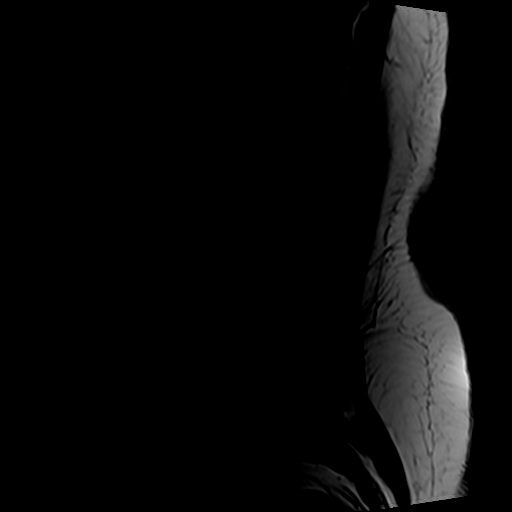
[im 10/16]
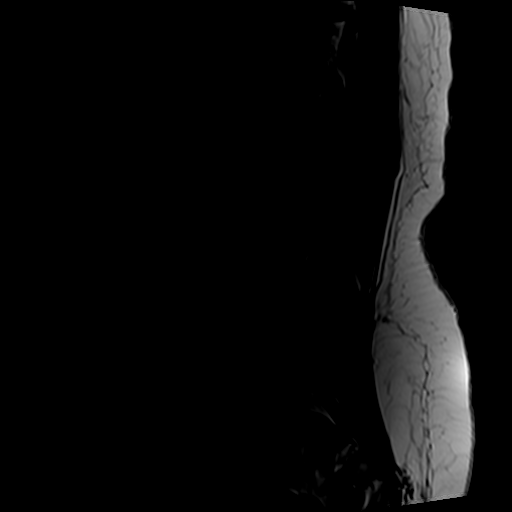
[im 13/16]
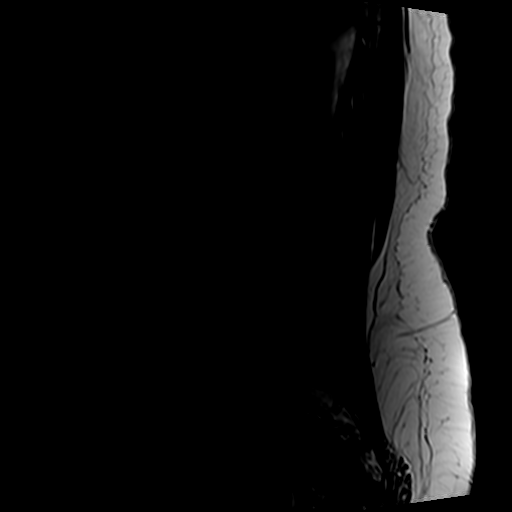
[im 16/16]
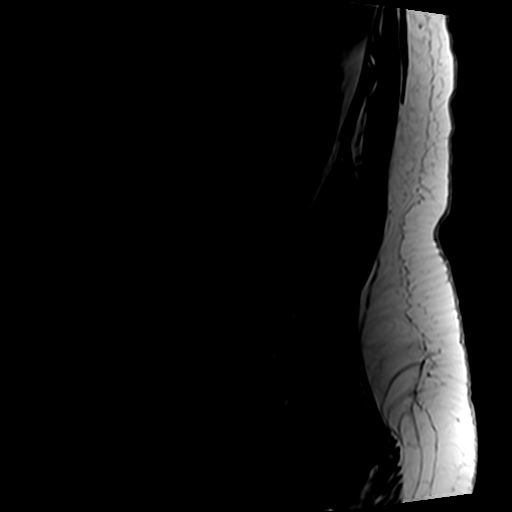

[Series 6: T2 · axial · 4.0mm · 0.70mm/px · z∈[-32,+179]mm · 8 of 36 slices shown (2 of 2)]
[im 1/36]
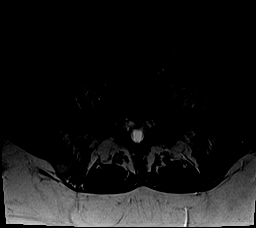
[im 6/36]
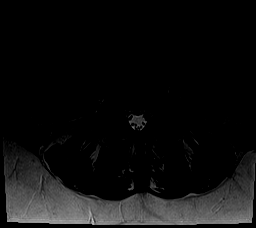
[im 11/36]
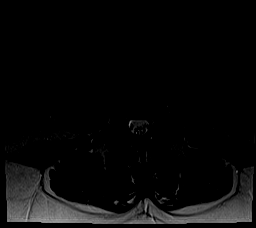
[im 17/36]
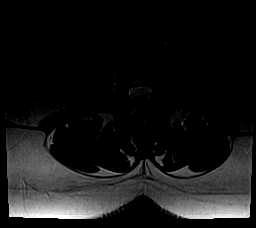
[im 19/36]
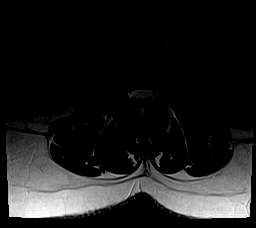
[im 25/36]
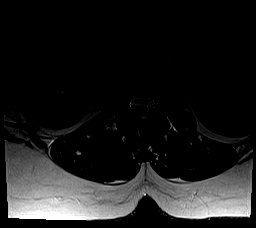
[im 30/36]
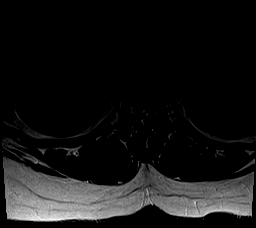
[im 36/36]
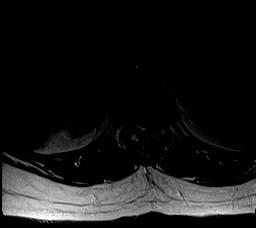

[Series 7: T1 · axial · 4.0mm · 0.35mm/px · z∈[-32,+148]mm · 4 of 36 slices shown (2 of 2)]
[im 1/36]
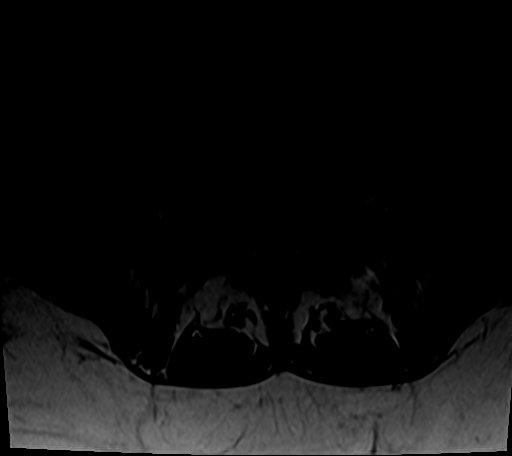
[im 6/36]
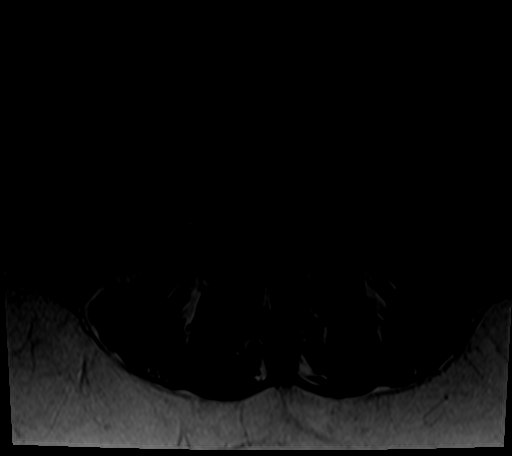
[im 19/36]
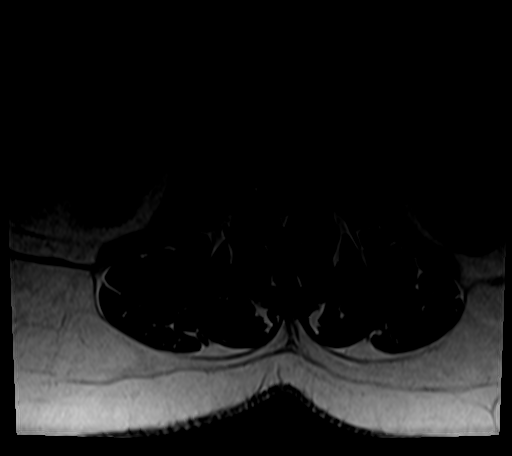
[im 30/36]
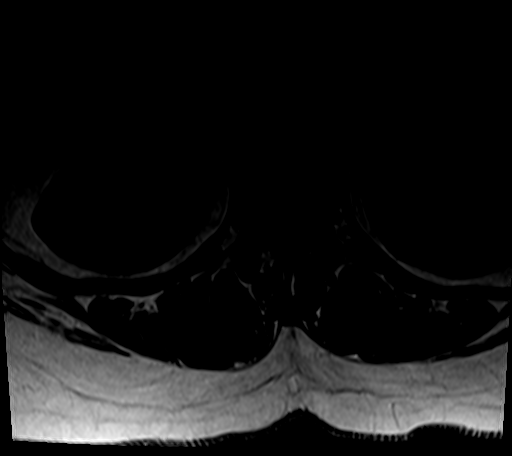

[25 of 48 positions shown; findings below may reference images not displayed]

FINDINGS: Segmentation:  5 lumbar type vertebral bodies.

Alignment:  Normal

Vertebrae: Normal except for a benign appearing hemangioma within
the L3 vertebral body.

Conus medullaris and cauda equina: Conus extends to the L1-2 level.
Conus and cauda equina appear normal.

Paraspinal and other soft tissues: Normal

Disc levels:

No disc abnormality at L3-4 or above. Canal and foramina are widely
patent.

L4-5: Mild desiccation of the disc with annular bulging and small
annular fissure. Mild facet and ligamentous hypertrophy. Mild
stenosis of both lateral recesses but without clear neural
compression. Findings could relate to back pain.

L5-S1: Central disc herniation. Facet and ligamentous hypertrophy.
Stenosis of the subarticular lateral recesses that could affect
either S1 nerve. Stenosis may be slightly worse on the left.
IMPRESSION: No visible change since [REDACTED] Annular bulging with annular fissure. Mild facet and
ligamentous hypertrophy. Mild stenosis the lateral recesses.
Definite neural compression is not demonstrated. Neural irritation
could conceivably occur. Findings in general could relate to low
back pain.

L5-S1: Central disc herniation. Bilateral facet degeneration and
hypertrophy. Stenosis of the subarticular lateral recesses that
could compress either or both S1 nerves. Stenosis probably slightly
more severe on the left.

## 2021-03-18 ENCOUNTER — Other Ambulatory Visit: Payer: Self-pay | Admitting: Orthopedic Surgery

## 2021-04-02 ENCOUNTER — Other Ambulatory Visit: Payer: Self-pay | Admitting: Neurosurgery

## 2021-04-02 ENCOUNTER — Telehealth: Payer: Self-pay

## 2021-04-02 DIAGNOSIS — M5432 Sciatica, left side: Secondary | ICD-10-CM

## 2021-04-02 NOTE — Progress Notes (Signed)
Phone call to patient to verify medication list and allergies for myelogram procedure. Pt instructed to hold  Wellbutrin and Zoloft for 48hrs prior to myelogram appointment time and 24 hours after appointment. Pt also instructed to have a driver the day of the procedure, the procedure would take around 2 hours, and discharge instructions discussed. Pt verbalized understanding.

## 2021-04-09 ENCOUNTER — Other Ambulatory Visit: Payer: Self-pay

## 2021-04-09 ENCOUNTER — Ambulatory Visit
Admission: RE | Admit: 2021-04-09 | Discharge: 2021-04-09 | Disposition: A | Discharge status: Home / Self Care | Payer: BC Managed Care – PPO | Source: Ambulatory Visit | Attending: Neurosurgery | Admitting: Neurosurgery

## 2021-04-09 DIAGNOSIS — M5432 Sciatica, left side: Secondary | ICD-10-CM

## 2021-04-09 IMAGING — XA DG MYELOGRAPHY LUMBAR INJ LUMBOSACRAL
14 of 18 series · 14 of 18 positions shown · non-contrast
Comparison: Lumbar spine MRI [DATE]

CLINICAL DATA: 45-year-old female with lumbosacral spondylosis
lower pain.

[Series 1: w lumbar spine ap · 0.15mm/px · 1 of 1 slices shown]
[im 1/1]
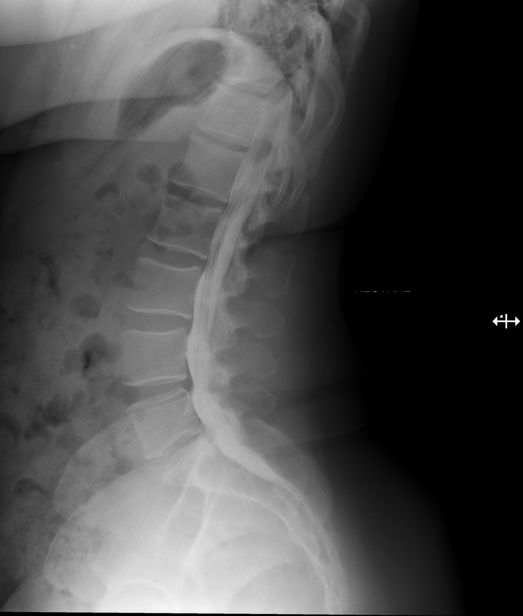

[Series 1: vasc adipose · 1 of 1 slices shown (1 of 12)]
[im 1/1]
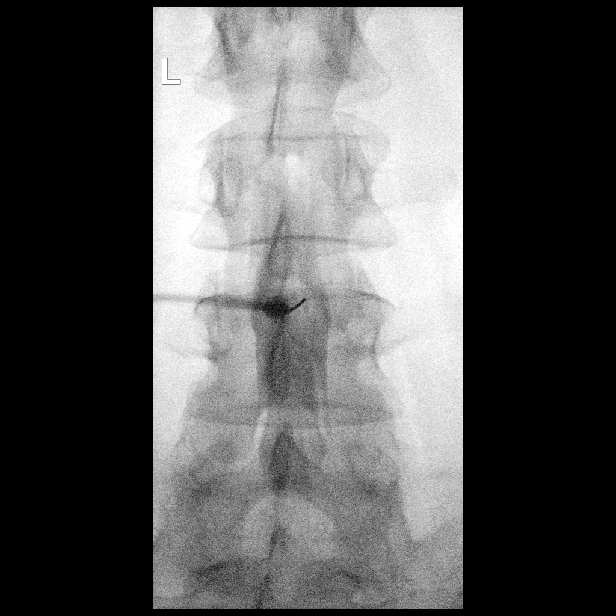

[Series 3: w lumbar spine extension · 0.15mm/px · 1 of 1 slices shown]
[im 1/1]
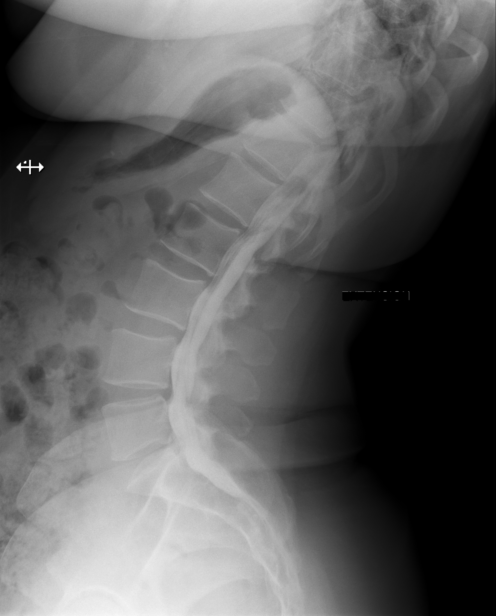

[Series 7: vasc adipose · 1 of 1 slices shown (2 of 12)]
[im 1/1]
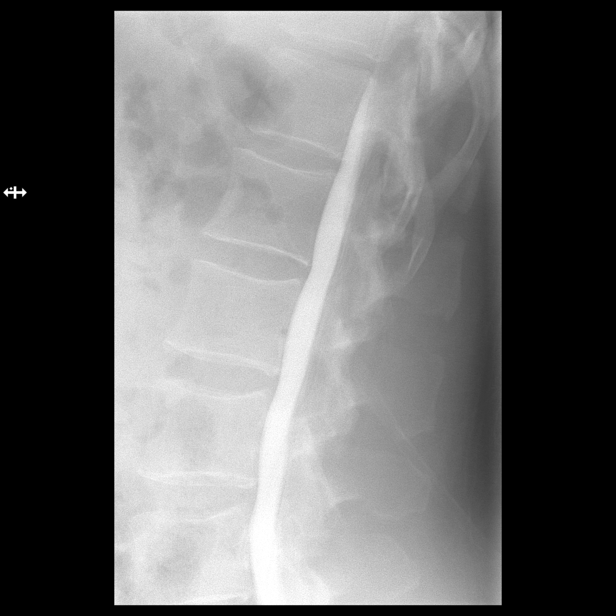

[Series 8: vasc adipose · 1 of 1 slices shown (3 of 12)]
[im 1/1]
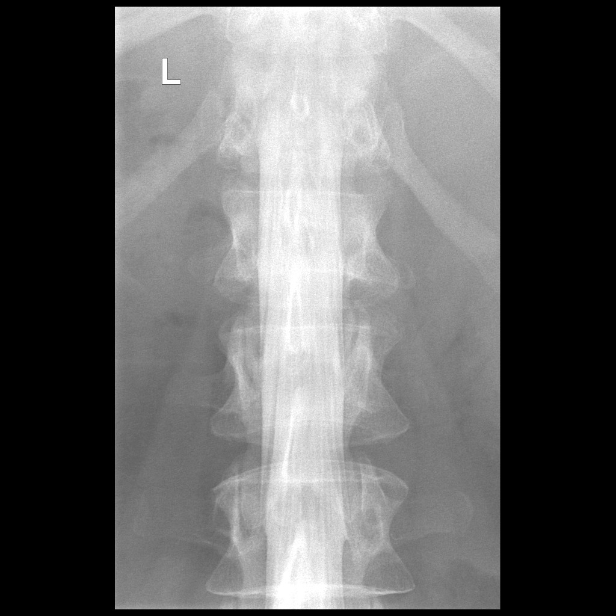

[Series 10: vasc adipose · 1 of 1 slices shown (4 of 12)]
[im 1/1]
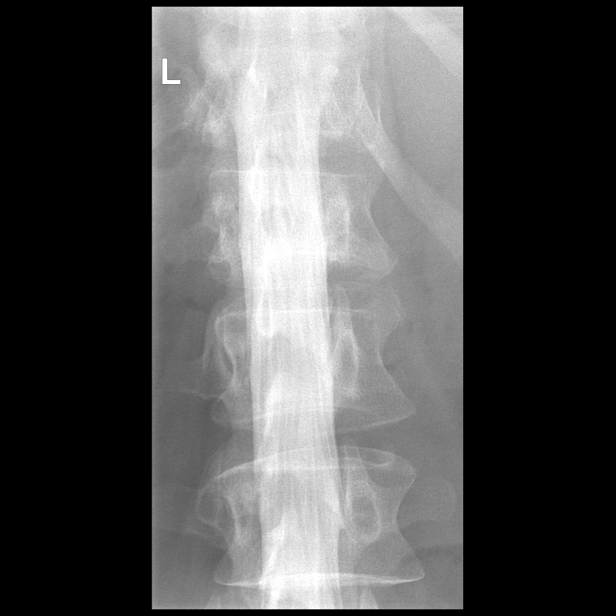

[Series 11: vasc adipose · 1 of 1 slices shown (5 of 12)]
[im 1/1]
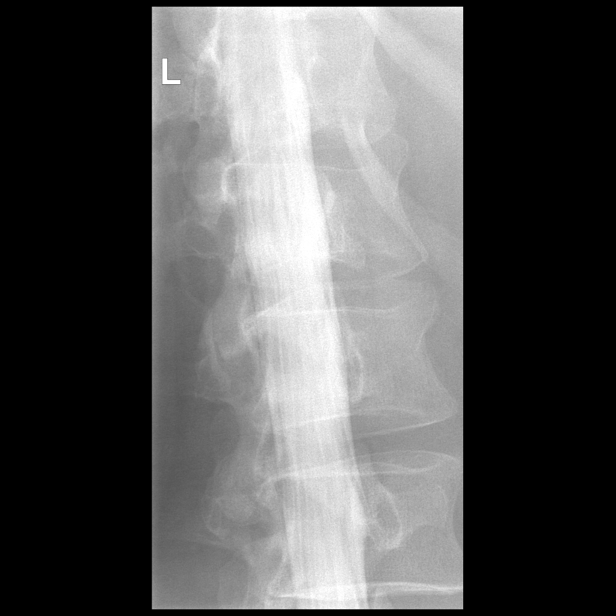

[Series 12: vasc adipose · 1 of 1 slices shown (6 of 12)]
[im 1/1]
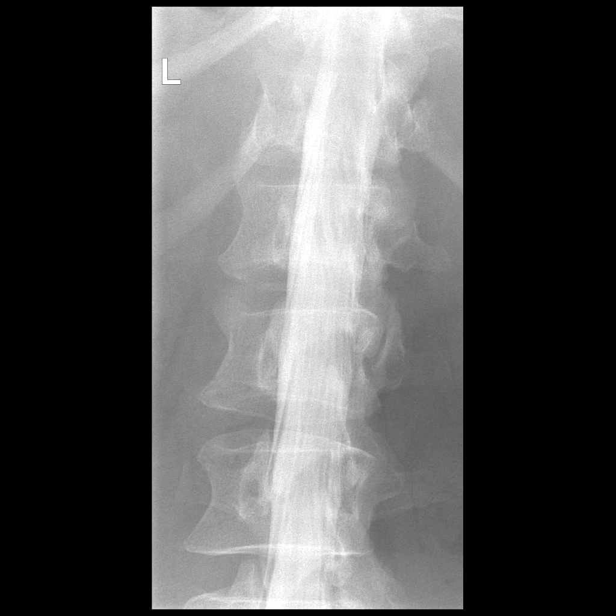

[Series 13: vasc adipose · 1 of 1 slices shown (7 of 12)]
[im 1/1]
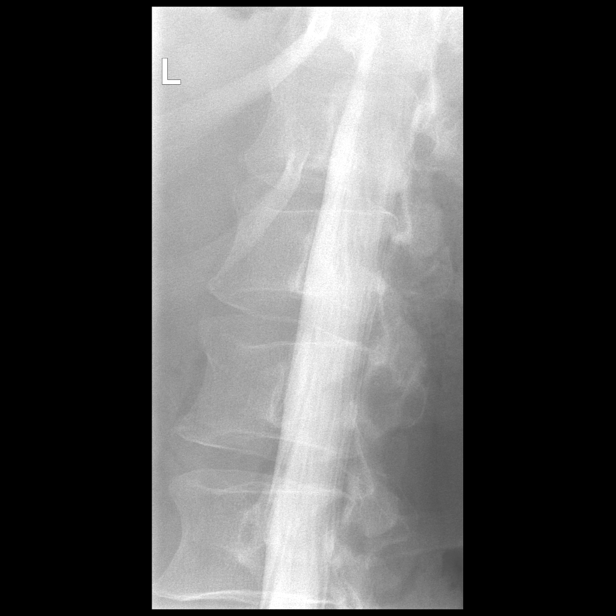

[Series 15: vasc adipose · 1 of 1 slices shown (8 of 12)]
[im 1/1]
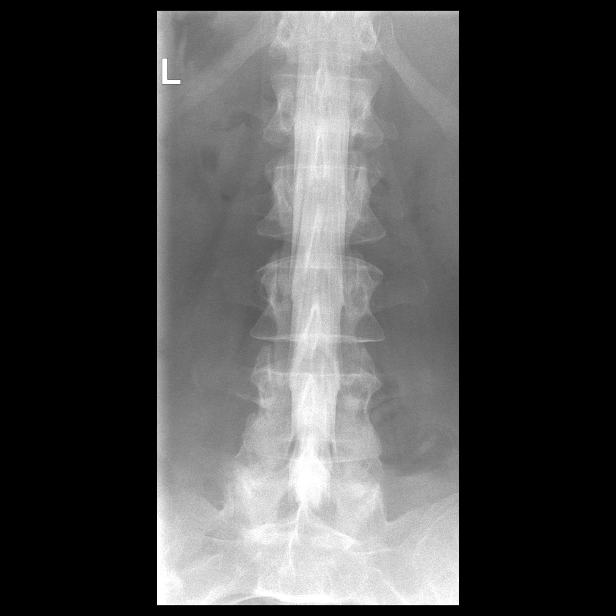

[Series 16: vasc adipose · 1 of 1 slices shown (9 of 12)]
[im 1/1]
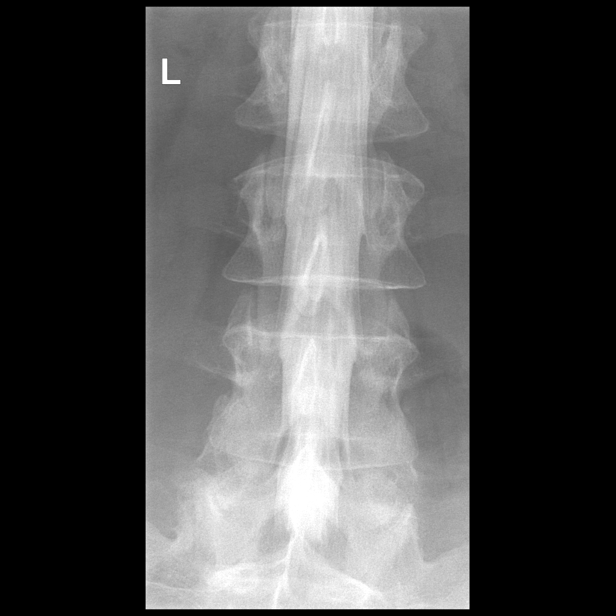

[Series 17: vasc adipose · 1 of 1 slices shown (10 of 12)]
[im 1/1]
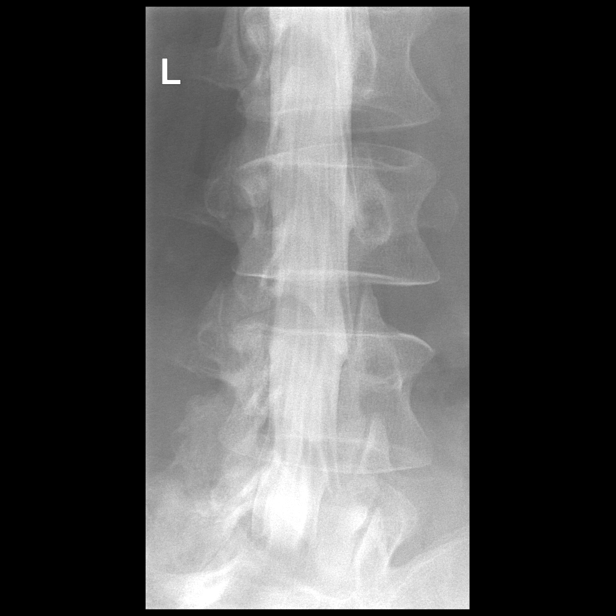

[Series 19: vasc adipose · 1 of 1 slices shown (11 of 12)]
[im 1/1]
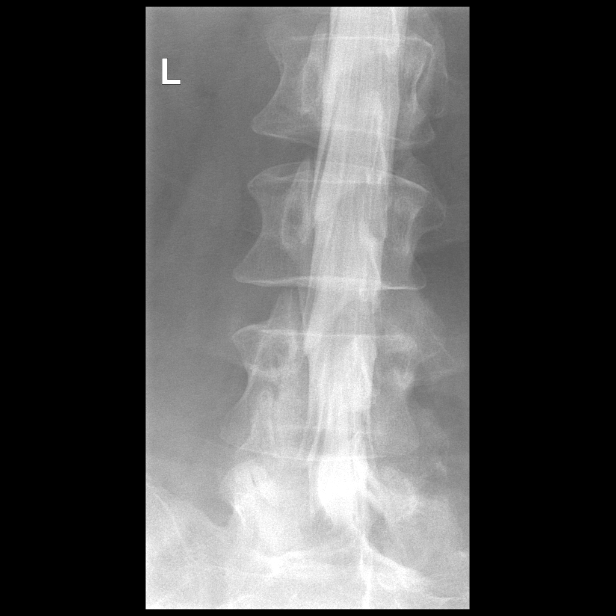

[Series 20: vasc adipose · 1 of 1 slices shown (12 of 12)]
[im 1/1]
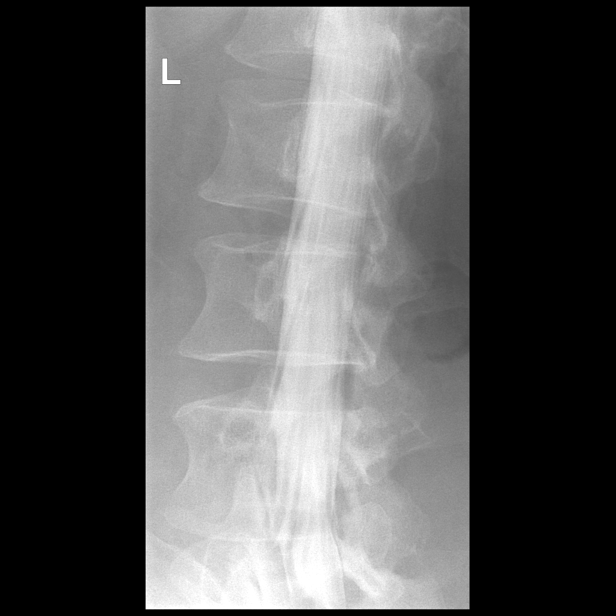

[14 of 18 positions shown; findings below may reference images not displayed]

FLUOROSCOPY TIME:  1 minutes 44 seconds 27.6 mGy

PROCEDURE:
LUMBAR MYELOGRAM

After thorough discussion of risks and benefits of the procedure
including bleeding, infection, injury to nerves, blood vessels,
adjacent structures as well as headache and CSF leak, written and
oral informed consent was obtained. Consent was obtained by Dr.
AYON. Time out form was completed.

Patient was positioned prone on the fluoroscopy table. Local
anesthesia was provided with 1% lidocaine without epinephrine after
prepped and draped in the usual sterile fashion. Puncture was
performed at L3-L4 using a 3 1/2 inch 22-gauge spinal needle via
paramedian approach. Using a single pass through the dura, the
needle was placed within the thecal sac, with return of clear CSF.
15 mL of Isovue [UX] was injected into the thecal sac, with normal
opacification of the nerve roots and cauda equina consistent with
free flow within the subarachnoid space.

I personally performed the lumbar puncture and administered the
intrathecal contrast. I also personally supervised acquisition of
the myelogram images.
FINDINGS: Alignment: Normal alignment. No evidence of anterolisthesis. No
instability during flexion or extension.

Vertebra: No acute fracture or malalignment. Vertebral body heights
are maintained.

Surgical changes: None

L1-L2: Normal

L2-L3: Normal.

L3-L4: Small ventral epidural defect.

L4-L5: Moderate ventral epidural defect.

L5-S1: Moderate to large ventral epidural defect.
IMPRESSION: 1. Successful L3-L4 lumbar puncture for myelogram.
2. Normal alignment without evidence of acute fracture, malalignment
or bony lesion.
3. No evidence of instability during flexion or extension.
4. Moderate ventral epidural defect at L4-L5.
5. Moderate to large ventral epidural defect at L5-S1.

## 2021-04-09 IMAGING — CT CT L SPINE W/ CM
1 of 7 series · 6 of 14 positions shown, 8 images · IV contrast (isovue)
Comparison: Myelogram images same day.

CLINICAL DATA: Lumbar CT myelography. Lumbosacral spondylosis and
low back pain.

EXAM:
CT MYELOGRAPHY LUMBAR SPINE
TECHNIQUE: CT imaging of the lumbar spine was performed after Isovue 200M
contrast administration. Multiplanar CT image reconstructions were
also generated.

[Series 3: l spine soft · axial · 0.34mm/px · z∈[-278,-118]mm · 6 of 113 slices shown, 8 images]
[im 17/113  soft-tissue]
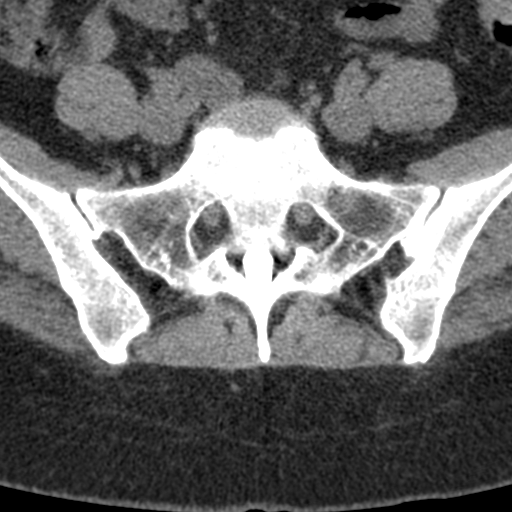
[im 17/113  bone]
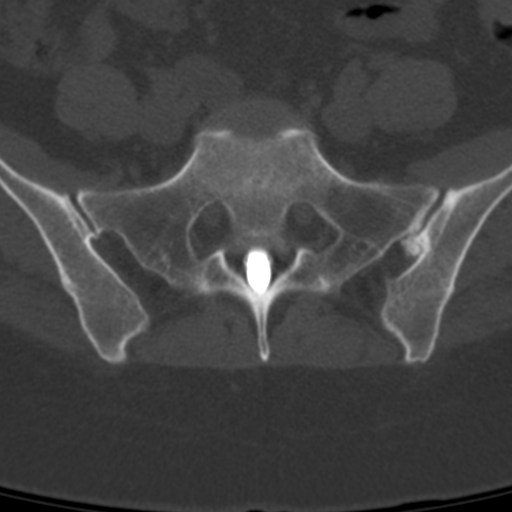
[im 33/113  bone]
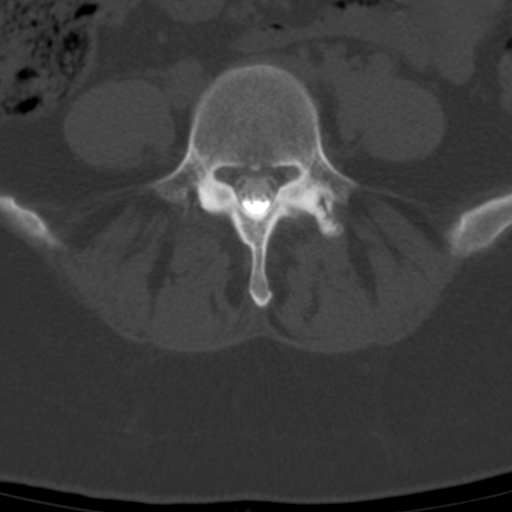
[im 49/113  bone]
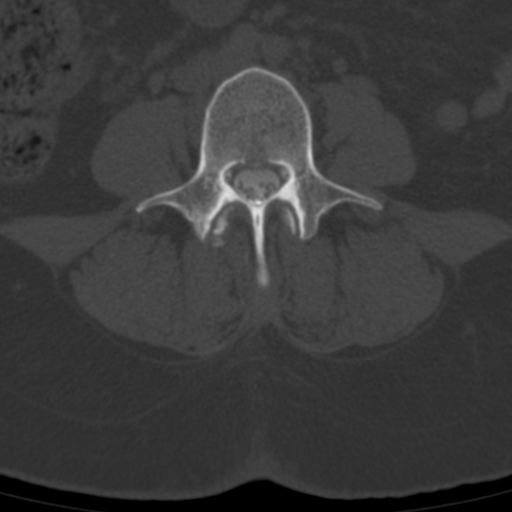
[im 65/113  bone]
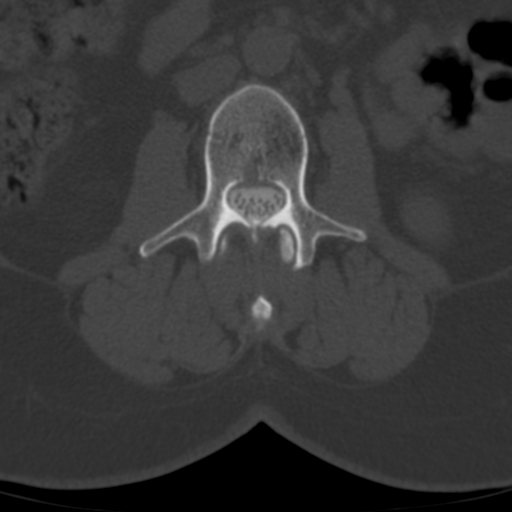
[im 81/113  soft-tissue]
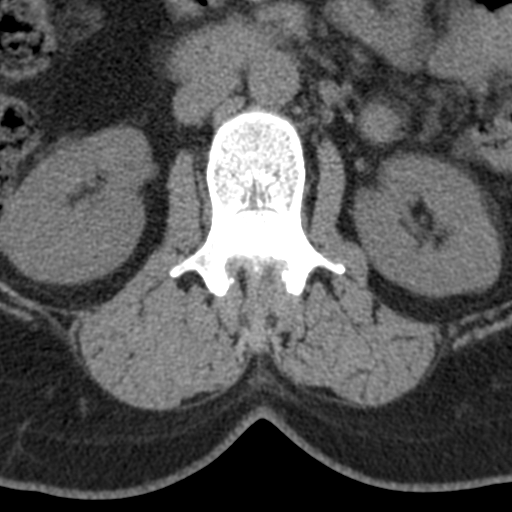
[im 81/113  bone]
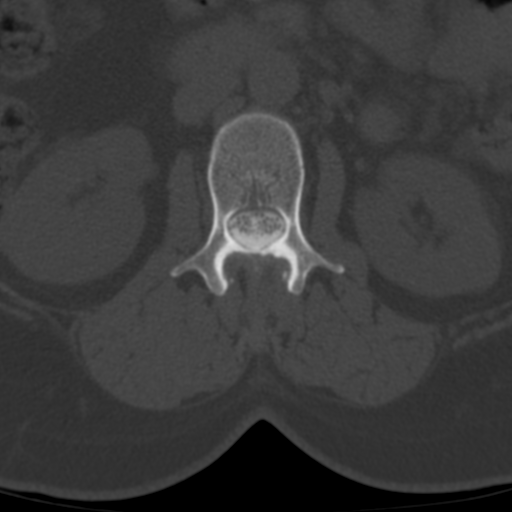
[im 97/113  bone]
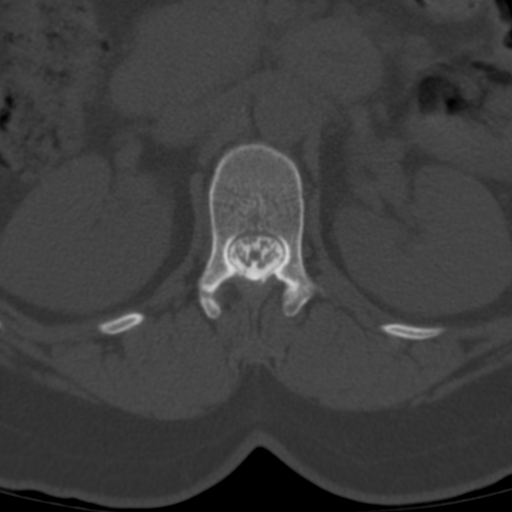

[6 of 14 positions shown; findings below may reference images not displayed]

FINDINGS: Segmentation:  5 lumbar type vertebral bodies.

Alignment: Normal

Vertebrae: No fracture or focal bone lesion.

Conus medullaris: Extends to the L1 level and appears normal.

Paraspinal and other soft tissues: Negative

Disc levels:

No disc level abnormality at L3-4 or above.

L4-5: Mild bulging of the disc. There is mild facet degeneration and
hypertrophy. Mild narrowing of the lateral recesses without visible
neural compression.

L5-S1: Central disc protrusion. Bilateral facet degeneration and
hypertrophy. Narrowing of the subarticular lateral recesses that
could possibly affect either S1 nerve. Foramina appear sufficiently
patent to allow exiting of the L5 nerves.
IMPRESSION: L3-4 and above: Normal

L4-5: Bulging of the disc. Mild facet and ligamentous hypertrophy.
Mild stenosis of the lateral recesses but without definite neural
compression.

L5-S1: Broad-based disc protrusion. Facet osteoarthritis and
hypertrophy. Narrowing of the subarticular lateral recesses that
could compress either or both S1 nerves.

## 2021-04-09 MED ORDER — DIAZEPAM 5 MG PO TABS
10.0000 mg | ORAL_TABLET | Freq: Once | ORAL | Status: AC
  Administered 2021-04-09: 10 mg via ORAL

## 2021-04-09 MED ORDER — IOPAMIDOL (ISOVUE-M 200) INJECTION 41%
18.0000 mL | Freq: Once | INTRAMUSCULAR | Status: AC
  Administered 2021-04-09: 18 mL via INTRATHECAL

## 2021-04-09 NOTE — Discharge Instructions (Addendum)
Myelogram Discharge Instructions  Go home and rest quietly as needed. You may resume normal activities; however, do not exert yourself strongly or do any heavy lifting today and tomorrow.   DO NOT drive today.    You may resume your normal diet and medications unless otherwise indicated. Drink lots of extra fluids today and tomorrow.   The incidence of headache, nausea, or vomiting is about 5% (one in 20 patients).  If you develop a headache, lie flat for 24 hours and drink plenty of fluids until the headache goes away.  Caffeinated beverages may be helpful. If when you get up you still have a headache when standing, go back to bed and force fluids for another 24 hours.   If you develop severe nausea and vomiting or a headache that does not go away with the flat bedrest after 48 hours, please call [phone].   Call your physician for a follow-up appointment.  The results of your myelogram will be sent directly to your physician by the following day.  If you have any questions or if complications develop after you arrive home, please call [phone].  Discharge instructions have been explained to the patient.  The patient, or the person responsible for the patient, fully understands these instructions.   Thank you for visiting our office today.    YOU MAY RESUME YOUR WELLBUTRIN AND ZOLOFT TOMORROW 04/10/21 AT 9:30 AM

## 2021-04-24 NOTE — Progress Notes (Signed)
Surgical Instructions    Your procedure is scheduled on 04/29/21.  Report to [facility] Main Entrance "A" at 11:00 A.M., then check in with the Admitting office.  Call this number if you have problems the morning of surgery:  [phone]   If you have any questions prior to your surgery date call [phone]: Open Monday-Friday 8am-4pm    Remember:  Do not eat after midnight the night before your surgery  You may drink clear liquids until 11:00am the morning of your surgery.   Clear liquids allowed are: Water, Non-Citrus Juices (without pulp), Carbonated Beverages, Clear Tea, Black Coffee Only, and Gatorade  Patient Instructions  The night before surgery:  No food after midnight. ONLY clear liquids after midnight  The day of surgery (if you do NOT have diabetes):  Drink ONE (1) Pre-Surgery Clear Ensure by 11:00am the morning of surgery. Drink in one sitting. Do not sip.  This drink was given to you during your hospital  pre-op appointment visit. Nothing else to drink after completing the  Pre-Surgery Clear Ensure.          If you have questions, please contact your surgeon's office.     Take these medicines the morning of surgery with A SIP OF WATER  buPROPion (WELLBUTRIN XL)  sertraline (ZOLOFT)    As of today, STOP taking any Aspirin (unless otherwise instructed by your surgeon) Aleve, Naproxen, Ibuprofen, Motrin, Advil, Goody's, BC's, all herbal medications, fish oil, and all vitamins.          Do not wear jewelry or makeup Do not wear lotions, powders, perfumes/colognes, or deodorant. Do not shave 48 hours prior to surgery.   Do not bring valuables to the hospital.  DO Not wear nail polish, gel polish, artificial nails, or any other type of covering on natural nails  including finger and toenails. If patients have artificial nails, gel coating, etc. that need to be removed by a nail salon please have this removed prior to surgery or surgery may need to be  canceled/delayed if the surgeon/ anesthesia feels like the patient is unable to be adequately monitored.             [facility] is not responsible for any belongings or valuables.  Do NOT Smoke (Tobacco/Vaping) or drink Alcohol 24 hours prior to your procedure If you use a CPAP at night, you may bring all equipment for your overnight stay.   Contacts, glasses, dentures or bridgework may not be worn into surgery, please bring cases for these belongings   For patients admitted to the hospital, discharge time will be determined by your treatment team.   Patients discharged the day of surgery will not be allowed to drive home, and someone needs to stay with them for 24 hours.  ONLY 1 SUPPORT PERSON MAY BE PRESENT WHILE YOU ARE IN SURGERY. IF YOU ARE TO BE ADMITTED ONCE YOU ARE IN YOUR ROOM YOU WILL BE ALLOWED TWO (2) VISITORS.  Minor children may have two parents present. Special consideration for safety and communication needs will be reviewed on a case by case basis.  Special instructions:    Oral Hygiene is also important to reduce your risk of infection.  Remember - BRUSH YOUR TEETH THE MORNING OF SURGERY WITH YOUR REGULAR TOOTHPASTE   [facility]- Preparing For Surgery  Before surgery, you can play an important role. Because skin is not sterile, your skin needs to be as free of germs as possible. You can reduce the  number of germs on your skin by washing with CHG (chlorahexidine gluconate) Soap before surgery.  CHG is an antiseptic cleaner which kills germs and bonds with the skin to continue killing germs even after washing.     Please do not use if you have an allergy to CHG or antibacterial soaps. If your skin becomes reddened/irritated stop using the CHG.  Do not shave (including legs and underarms) for at least 48 hours prior to first CHG shower. It is OK to shave your face.  Please follow these instructions carefully.     Shower the NIGHT BEFORE SURGERY and the MORNING OF  SURGERY with CHG Soap.   If you chose to wash your hair, wash your hair first as usual with your normal shampoo. After you shampoo, rinse your hair and body thoroughly to remove the shampoo.  Then [employer] and genitals (private parts) with your normal soap and rinse thoroughly to remove soap.  After that Use CHG Soap as you would any other liquid soap. You can apply CHG directly to the skin and wash gently with a scrungie or a clean washcloth.   Apply the CHG Soap to your body ONLY FROM THE NECK DOWN.  Do not use on open wounds or open sores. Avoid contact with your eyes, ears, mouth and genitals (private parts). Wash Face and genitals (private parts)  with your normal soap.   Wash thoroughly, paying special attention to the area where your surgery will be performed.  Thoroughly rinse your body with warm water from the neck down.  DO NOT shower/wash with your normal soap after using and rinsing off the CHG Soap.  Pat yourself dry with a CLEAN TOWEL.  Wear CLEAN PAJAMAS to bed the night before surgery  Place CLEAN SHEETS on your bed the night before your surgery  DO NOT SLEEP WITH PETS.   Day of Surgery: Take a shower with CHG soap. Wear Clean/Comfortable clothing the morning of surgery Do not apply any deodorants/lotions.   Remember to brush your teeth WITH YOUR REGULAR TOOTHPASTE.   Please read over the following fact sheets that you were given.

## 2021-04-27 ENCOUNTER — Other Ambulatory Visit: Payer: Self-pay

## 2021-04-27 ENCOUNTER — Encounter (HOSPITAL_COMMUNITY)
Admission: RE | Admit: 2021-04-27 | Discharge: 2021-04-27 | Disposition: A | Discharge status: Home / Self Care | Payer: BC Managed Care – PPO | Source: Ambulatory Visit | Attending: Orthopedic Surgery | Admitting: Orthopedic Surgery

## 2021-04-27 ENCOUNTER — Encounter (HOSPITAL_COMMUNITY): Payer: Self-pay

## 2021-04-27 DIAGNOSIS — Z20822 Contact with and (suspected) exposure to covid-19: Secondary | ICD-10-CM | POA: Insufficient documentation

## 2021-04-27 DIAGNOSIS — Z01812 Encounter for preprocedural laboratory examination: Secondary | ICD-10-CM | POA: Insufficient documentation

## 2021-04-27 DIAGNOSIS — Z79899 Other long term (current) drug therapy: Secondary | ICD-10-CM | POA: Diagnosis not present

## 2021-04-27 DIAGNOSIS — M5117 Intervertebral disc disorders with radiculopathy, lumbosacral region: Secondary | ICD-10-CM | POA: Diagnosis present

## 2021-04-27 HISTORY — DX: Anxiety disorder, unspecified: F41.9

## 2021-04-27 HISTORY — DX: Family history of other specified conditions: Z84.89

## 2021-04-27 LAB — CBC WITH DIFFERENTIAL/PLATELET
Abs Immature Granulocytes: 0.03 10*3/uL (ref 0.00–0.07)
Basophils Absolute: 0 10*3/uL (ref 0.0–0.1)
Basophils Relative: 1 %
Eosinophils Absolute: 0.1 10*3/uL (ref 0.0–0.5)
Eosinophils Relative: 1 %
HCT: 42 % (ref 36.0–46.0)
Hemoglobin: 13.7 g/dL (ref 12.0–15.0)
Immature Granulocytes: 0 %
Lymphocytes Relative: 21 %
Lymphs Abs: 1.6 10*3/uL (ref 0.7–4.0)
MCH: 25.9 pg — ABNORMAL LOW (ref 26.0–34.0)
MCHC: 32.6 g/dL (ref 30.0–36.0)
MCV: 79.4 fL — ABNORMAL LOW (ref 80.0–100.0)
Monocytes Absolute: 0.4 10*3/uL (ref 0.1–1.0)
Monocytes Relative: 5 %
Neutro Abs: 5.3 10*3/uL (ref 1.7–7.7)
Neutrophils Relative %: 72 %
Platelets: 238 10*3/uL (ref 150–400)
RBC: 5.29 MIL/uL — ABNORMAL HIGH (ref 3.87–5.11)
RDW: 13.8 % (ref 11.5–15.5)
WBC: 7.3 10*3/uL (ref 4.0–10.5)
nRBC: 0 % (ref 0.0–0.2)

## 2021-04-27 LAB — COMPREHENSIVE METABOLIC PANEL
ALT: 21 U/L (ref 0–44)
AST: 26 U/L (ref 15–41)
Albumin: 3.9 g/dL (ref 3.5–5.0)
Alkaline Phosphatase: 76 U/L (ref 38–126)
Anion gap: 9 (ref 5–15)
BUN: 8 mg/dL (ref 6–20)
CO2: 25 mmol/L (ref 22–32)
Calcium: 9.2 mg/dL (ref 8.9–10.3)
Chloride: 103 mmol/L (ref 98–111)
Creatinine, Ser: 0.76 mg/dL (ref 0.44–1.00)
GFR, Estimated: >60 mL/min (ref >60)
Glucose, Bld: 111 mg/dL — ABNORMAL HIGH (ref 70–99)
Potassium: 3.1 mmol/L — ABNORMAL LOW (ref 3.5–5.1)
Sodium: 137 mmol/L (ref 135–145)
Total Bilirubin: 0.6 mg/dL (ref 0.3–1.2)
Total Protein: 7.1 g/dL (ref 6.5–8.1)

## 2021-04-27 LAB — URINALYSIS, ROUTINE W REFLEX MICROSCOPIC
Bilirubin Urine: NEGATIVE
Glucose, UA: NEGATIVE mg/dL
Hgb urine dipstick: NEGATIVE
Ketones, ur: NEGATIVE mg/dL
Leukocytes,Ua: NEGATIVE
Nitrite: NEGATIVE
Protein, ur: NEGATIVE mg/dL
Specific Gravity, Urine: 1.025 (ref 1.005–1.030)
pH: 5 (ref 5.0–8.0)

## 2021-04-27 LAB — SARS CORONAVIRUS 2 (TAT 6-24 HRS): SARS Coronavirus 2: NEGATIVE

## 2021-04-27 LAB — TYPE AND SCREEN
ABO/RH(D): O POS
Antibody Screen: NEGATIVE

## 2021-04-27 LAB — PROTIME-INR
INR: 1 (ref 0.8–1.2)
Prothrombin Time: 13.2 seconds (ref 11.4–15.2)

## 2021-04-27 LAB — SURGICAL PCR SCREEN
MRSA, PCR: NEGATIVE
Staphylococcus aureus: NEGATIVE

## 2021-04-27 LAB — APTT: aPTT: 28 seconds (ref 24–36)

## 2021-04-27 NOTE — Progress Notes (Signed)
PCP - Kathlen Brunswick, NP Cardiologist - denies  PPM/ICD - denies Device Orders - N/A Rep Notified - N/A  Chest x-ray - N/A EKG - N/A Stress Test - denies ECHO - denies Cardiac Cath - denies  Sleep Study - denies CPAP - N/A  Fasting Blood Sugar - N/A  Blood Thinner Instructions: N/A  Aspirin Instructions: Patient was instructed: As of today, STOP taking any Aspirin (unless otherwise instructed by your surgeon) Aleve, Naproxen, Ibuprofen, Motrin, Advil, Goody's, BC's, all herbal medications, fish oil, and all vitamins.  ERAS Protcol - yes PRE-SURGERY Ensure - yes  COVID TEST- 04/27/2021   Anesthesia review: yes; malignant hyperthermia - patient's mother  Patient denies shortness of breath, fever, cough and chest pain at PAT appointment   All instructions explained to the patient, with a verbal understanding of the material. Patient agrees to go over the instructions while at home for a better understanding. Patient also instructed to self quarantine after being tested for COVID-19. The opportunity to ask questions was provided.

## 2021-04-27 NOTE — Anesthesia Preprocedure Evaluation (Addendum)
Anesthesia Evaluation  Patient identified by MRN, date of birth, ID band Patient awake    Reviewed: Allergy & Precautions, NPO status , Patient's Chart, lab work & pertinent test results  History of Anesthesia Complications (+) Family history of anesthesia reaction and history of anesthetic complications (mother has MH)  Airway Mallampati: I  TM Distance: >3 FB Neck ROM: Full    Dental  (+) Teeth Intact, Dental Advisory Given   Pulmonary neg pulmonary ROS,    Pulmonary exam normal breath sounds clear to auscultation       Cardiovascular negative cardio ROS Normal cardiovascular exam Rhythm:Regular Rate:Normal     Neuro/Psych  Headaches, PSYCHIATRIC DISORDERS Anxiety Depression    GI/Hepatic negative GI ROS, Neg liver ROS,   Endo/Other  negative endocrine ROSPCOS  Renal/GU negative Renal ROS  negative genitourinary   Musculoskeletal negative musculoskeletal ROS (+)   Abdominal   Peds  Hematology negative hematology ROS (+)   Anesthesia Other Findings Strong family history of Malignant Hyperthermia. Pt's mother and maternal aunt both died from [facility] complications. The pt has never undergone biospy testing. She has had several prior surgeries, all as a presumed MH patient, and had no complications.  Reproductive/Obstetrics                            Anesthesia Physical Anesthesia Plan  ASA: 2  Anesthesia Plan: General   Post-op Pain Management:    Induction: Intravenous  PONV Risk Score and Plan: 3 and Midazolam, Dexamethasone, Ondansetron and TIVA  Airway Management Planned: Oral ETT  Additional Equipment:   Intra-op Plan:   Post-operative Plan: Extubation in OR  Informed Consent: I have reviewed the patients History and Physical, chart, labs and discussed the procedure including the risks, benefits and alternatives for the proposed anesthesia with the patient or authorized  representative who has indicated his/her understanding and acceptance.     Dental advisory given  Plan Discussed with: CRNA  Anesthesia Plan Comments: (Plan for MH precautions. )       Anesthesia Quick Evaluation

## 2021-04-29 ENCOUNTER — Ambulatory Visit (HOSPITAL_COMMUNITY)
Admission: RE | Admit: 2021-04-29 | Discharge: 2021-05-01 | Disposition: A | Discharge status: Home / Self Care | Payer: BC Managed Care – PPO | Attending: Orthopedic Surgery | Admitting: Orthopedic Surgery

## 2021-04-29 ENCOUNTER — Other Ambulatory Visit: Payer: Self-pay

## 2021-04-29 ENCOUNTER — Ambulatory Visit (HOSPITAL_COMMUNITY): Payer: BC Managed Care – PPO | Admitting: Physician Assistant

## 2021-04-29 ENCOUNTER — Ambulatory Visit (HOSPITAL_COMMUNITY): Payer: BC Managed Care – PPO

## 2021-04-29 ENCOUNTER — Encounter (HOSPITAL_COMMUNITY): Payer: Self-pay | Admitting: Orthopedic Surgery

## 2021-04-29 ENCOUNTER — Ambulatory Visit (HOSPITAL_COMMUNITY)
Admission: RE | Disposition: A | Discharge status: Home / Self Care | Payer: Self-pay | Source: Home / Self Care | Attending: Orthopedic Surgery

## 2021-04-29 DIAGNOSIS — Z20822 Contact with and (suspected) exposure to covid-19: Secondary | ICD-10-CM | POA: Insufficient documentation

## 2021-04-29 DIAGNOSIS — Z419 Encounter for procedure for purposes other than remedying health state, unspecified: Secondary | ICD-10-CM

## 2021-04-29 DIAGNOSIS — M5117 Intervertebral disc disorders with radiculopathy, lumbosacral region: Secondary | ICD-10-CM | POA: Diagnosis not present

## 2021-04-29 DIAGNOSIS — Z79899 Other long term (current) drug therapy: Secondary | ICD-10-CM | POA: Insufficient documentation

## 2021-04-29 HISTORY — PX: LUMBAR LAMINECTOMY/DECOMPRESSION MICRODISCECTOMY: SHX5026

## 2021-04-29 LAB — POCT PREGNANCY, URINE: Preg Test, Ur: NEGATIVE

## 2021-04-29 SURGERY — LUMBAR LAMINECTOMY/DECOMPRESSION MICRODISCECTOMY
Anesthesia: General | Laterality: Left

## 2021-04-29 MED ORDER — LIDOCAINE 2% (20 MG/ML) 5 ML SYRINGE
INTRAMUSCULAR | Status: AC
  Filled 2021-04-29: qty 5

## 2021-04-29 MED ORDER — ONDANSETRON HCL 4 MG/2ML IJ SOLN
INTRAMUSCULAR | Status: DC | PRN
  Administered 2021-04-29: 4 mg via INTRAVENOUS

## 2021-04-29 MED ORDER — HYDROCODONE-ACETAMINOPHEN 5-325 MG PO TABS: 1.0000 | ORAL_TABLET | Freq: Four times a day (QID) | ORAL | 0 refills | Status: AC | PRN

## 2021-04-29 MED ORDER — METHYLENE BLUE 0.5 % INJ SOLN
INTRAVENOUS | Status: AC
  Filled 2021-04-29: qty 10

## 2021-04-29 MED ORDER — FENTANYL CITRATE (PF) 100 MCG/2ML IJ SOLN: 25.0000 ug | INTRAMUSCULAR | Status: DC | PRN

## 2021-04-29 MED ORDER — SUGAMMADEX SODIUM 200 MG/2ML IV SOLN
INTRAVENOUS | Status: DC | PRN
  Administered 2021-04-29: 200 mg via INTRAVENOUS

## 2021-04-29 MED ORDER — CEFAZOLIN SODIUM-DEXTROSE 2-4 GM/100ML-% IV SOLN
INTRAVENOUS | Status: AC
  Filled 2021-04-29: qty 100

## 2021-04-29 MED ORDER — MIDAZOLAM HCL 2 MG/2ML IJ SOLN
INTRAMUSCULAR | Status: DC | PRN
  Administered 2021-04-29: 2 mg via INTRAVENOUS

## 2021-04-29 MED ORDER — THROMBIN 20000 UNITS EX SOLR
CUTANEOUS | Status: DC | PRN
  Administered 2021-04-29: 20 mL via TOPICAL

## 2021-04-29 MED ORDER — BUPIVACAINE-EPINEPHRINE (PF) 0.25% -1:200000 IJ SOLN
INTRAMUSCULAR | Status: AC
  Filled 2021-04-29: qty 30

## 2021-04-29 MED ORDER — LACTATED RINGERS IV SOLN: INTRAVENOUS | Status: DC

## 2021-04-29 MED ORDER — PROPOFOL 10 MG/ML IV BOLUS
INTRAVENOUS | Status: DC | PRN
  Administered 2021-04-29: 20 mg via INTRAVENOUS
  Administered 2021-04-29: 140 mg via INTRAVENOUS

## 2021-04-29 MED ORDER — 0.9 % SODIUM CHLORIDE (POUR BTL) OPTIME
TOPICAL | Status: DC | PRN
  Administered 2021-04-29: 1000 mL

## 2021-04-29 MED ORDER — METHYLPREDNISOLONE ACETATE 40 MG/ML IJ SUSP
INTRAMUSCULAR | Status: AC
  Filled 2021-04-29: qty 1

## 2021-04-29 MED ORDER — SUFENTANIL CITRATE 50 MCG/ML IV SOLN
INTRAVENOUS | Status: AC
  Filled 2021-04-29: qty 1

## 2021-04-29 MED ORDER — METHOCARBAMOL 500 MG PO TABS: 500.0000 mg | ORAL_TABLET | Freq: Four times a day (QID) | ORAL | 0 refills | Status: AC | PRN

## 2021-04-29 MED ORDER — PROPOFOL 10 MG/ML IV BOLUS
INTRAVENOUS | Status: AC
  Filled 2021-04-29: qty 20

## 2021-04-29 MED ORDER — THROMBIN (RECOMBINANT) 20000 UNITS EX SOLR
CUTANEOUS | Status: AC
  Filled 2021-04-29: qty 20000

## 2021-04-29 MED ORDER — PROPOFOL 500 MG/50ML IV EMUL
INTRAVENOUS | Status: DC | PRN
  Administered 2021-04-29: 125 ug/kg/min via INTRAVENOUS

## 2021-04-29 MED ORDER — BUPIVACAINE-EPINEPHRINE 0.25% -1:200000 IJ SOLN
INTRAMUSCULAR | Status: DC | PRN
  Administered 2021-04-29: 3.5 mL
  Administered 2021-04-29: 20 mL

## 2021-04-29 MED ORDER — DEXAMETHASONE SODIUM PHOSPHATE 10 MG/ML IJ SOLN
INTRAMUSCULAR | Status: DC | PRN
  Administered 2021-04-29: 5 mg via INTRAVENOUS

## 2021-04-29 MED ORDER — SUFENTANIL CITRATE 50 MCG/ML IV SOLN
INTRAVENOUS | Status: DC | PRN
  Administered 2021-04-29: 1 ug via INTRAVENOUS
  Administered 2021-04-29: 20 ug via INTRAVENOUS

## 2021-04-29 MED ORDER — CEFAZOLIN SODIUM-DEXTROSE 2-4 GM/100ML-% IV SOLN
2.0000 g | INTRAVENOUS | Status: AC
  Administered 2021-04-29: 2 g via INTRAVENOUS

## 2021-04-29 MED ORDER — CHLORHEXIDINE GLUCONATE 0.12 % MT SOLN: 15.0000 mL | Freq: Once | OROMUCOSAL | Status: AC

## 2021-04-29 MED ORDER — POVIDONE-IODINE 7.5 % EX SOLN
Freq: Once | CUTANEOUS | Status: DC
  Filled 2021-04-29: qty 118

## 2021-04-29 MED ORDER — ONDANSETRON HCL 4 MG/2ML IJ SOLN
INTRAMUSCULAR | Status: AC
  Filled 2021-04-29: qty 2

## 2021-04-29 MED ORDER — BUPIVACAINE LIPOSOME 1.3 % IJ SUSP
INTRAMUSCULAR | Status: AC
  Filled 2021-04-29: qty 20

## 2021-04-29 MED ORDER — ROCURONIUM BROMIDE 10 MG/ML (PF) SYRINGE
PREFILLED_SYRINGE | INTRAVENOUS | Status: AC
  Filled 2021-04-29: qty 10

## 2021-04-29 MED ORDER — BUPIVACAINE LIPOSOME 1.3 % IJ SUSP
INTRAMUSCULAR | Status: DC | PRN
  Administered 2021-04-29: 20 mL

## 2021-04-29 MED ORDER — LIDOCAINE 2% (20 MG/ML) 5 ML SYRINGE
INTRAMUSCULAR | Status: DC | PRN
  Administered 2021-04-29: 60 mg via INTRAVENOUS

## 2021-04-29 MED ORDER — METHYLENE BLUE 0.5 % INJ SOLN
INTRAVENOUS | Status: DC | PRN
  Administered 2021-04-29: 1 mL via SUBMUCOSAL

## 2021-04-29 MED ORDER — ORAL CARE MOUTH RINSE: 15.0000 mL | Freq: Once | OROMUCOSAL | Status: AC

## 2021-04-29 MED ORDER — PHENYLEPHRINE 40 MCG/ML (10ML) SYRINGE FOR IV PUSH (FOR BLOOD PRESSURE SUPPORT)
PREFILLED_SYRINGE | INTRAVENOUS | Status: AC
  Filled 2021-04-29: qty 10

## 2021-04-29 MED ORDER — CHLORHEXIDINE GLUCONATE 0.12 % MT SOLN
OROMUCOSAL | Status: AC
  Administered 2021-04-29: 15 mL via OROMUCOSAL
  Filled 2021-04-29: qty 15

## 2021-04-29 MED ORDER — HEMOSTATIC AGENTS (NO CHARGE) OPTIME
TOPICAL | Status: DC | PRN
  Administered 2021-04-29: 1 via TOPICAL

## 2021-04-29 MED ORDER — METHYLPREDNISOLONE ACETATE 40 MG/ML IJ SUSP
INTRAMUSCULAR | Status: DC | PRN
  Administered 2021-04-29: 40 mg

## 2021-04-29 MED ORDER — MIDAZOLAM HCL 2 MG/2ML IJ SOLN
INTRAMUSCULAR | Status: AC
  Filled 2021-04-29: qty 2

## 2021-04-29 MED ORDER — DEXAMETHASONE SODIUM PHOSPHATE 10 MG/ML IJ SOLN
INTRAMUSCULAR | Status: AC
  Filled 2021-04-29: qty 1

## 2021-04-29 MED ORDER — PHENYLEPHRINE 40 MCG/ML (10ML) SYRINGE FOR IV PUSH (FOR BLOOD PRESSURE SUPPORT)
PREFILLED_SYRINGE | INTRAVENOUS | Status: DC | PRN
  Administered 2021-04-29 (×2): 80 ug via INTRAVENOUS

## 2021-04-29 MED ORDER — ROCURONIUM BROMIDE 10 MG/ML (PF) SYRINGE
PREFILLED_SYRINGE | INTRAVENOUS | Status: DC | PRN
  Administered 2021-04-29: 70 mg via INTRAVENOUS

## 2021-04-29 SURGICAL SUPPLY — 67 items
BAG COUNTER SPONGE SURGICOUNT (BAG) ×2 IMPLANT
BENZOIN TINCTURE PRP APPL 2/3 (GAUZE/BANDAGES/DRESSINGS) ×4 IMPLANT
BNDG GAUZE ELAST 4 BULKY (GAUZE/BANDAGES/DRESSINGS) ×2 IMPLANT
BUR ROUND PRECISION 4.0 (BURR) ×2 IMPLANT
CABLE BIPOLOR RESECTION CORD (MISCELLANEOUS) ×2 IMPLANT
CANISTER SUCT 3000ML PPV (MISCELLANEOUS) ×2 IMPLANT
CARTRIDGE OIL MAESTRO DRILL (MISCELLANEOUS) ×1 IMPLANT
CLSR STERI-STRIP ANTIMIC 1/2X4 (GAUZE/BANDAGES/DRESSINGS) ×2 IMPLANT
COVER SURGICAL LIGHT HANDLE (MISCELLANEOUS) ×2 IMPLANT
DIFFUSER DRILL AIR PNEUMATIC (MISCELLANEOUS) ×2 IMPLANT
DRAPE POUCH INSTRU U-SHP 10X18 (DRAPES) ×4 IMPLANT
DRAPE SURG 17X23 STRL (DRAPES) ×8 IMPLANT
DURAPREP 26ML APPLICATOR (WOUND CARE) ×2 IMPLANT
ELECT BLADE 4.0 EZ CLEAN MEGAD (MISCELLANEOUS) ×2
ELECT CAUTERY BLADE 6.4 (BLADE) ×2 IMPLANT
ELECT REM PT RETURN 9FT ADLT (ELECTROSURGICAL) ×2
ELECTRODE BLDE 4.0 EZ CLN MEGD (MISCELLANEOUS) ×1 IMPLANT
ELECTRODE REM PT RTRN 9FT ADLT (ELECTROSURGICAL) ×1 IMPLANT
FILTER STRAW FLUID ASPIR (MISCELLANEOUS) ×4 IMPLANT
GAUZE 4X4 16PLY ~~LOC~~+RFID DBL (SPONGE) ×4 IMPLANT
GAUZE SPONGE 4X4 12PLY STRL (GAUZE/BANDAGES/DRESSINGS) ×2 IMPLANT
GLOVE SRG 8 PF TXTR STRL LF DI (GLOVE) ×1 IMPLANT
GLOVE SURG ENC MOIS LTX SZ7 (GLOVE) ×2 IMPLANT
GLOVE SURG ENC MOIS LTX SZ8 (GLOVE) ×2 IMPLANT
GLOVE SURG UNDER POLY LF SZ7 (GLOVE) ×2 IMPLANT
GLOVE SURG UNDER POLY LF SZ8 (GLOVE) ×2
GOWN STRL REUS W/ TWL LRG LVL3 (GOWN DISPOSABLE) ×1 IMPLANT
GOWN STRL REUS W/ TWL XL LVL3 (GOWN DISPOSABLE) ×2 IMPLANT
GOWN STRL REUS W/TWL LRG LVL3 (GOWN DISPOSABLE) ×2
GOWN STRL REUS W/TWL XL LVL3 (GOWN DISPOSABLE) ×4
IV CATH 14GX2 1/4 (CATHETERS) ×2 IMPLANT
KIT BASIN OR (CUSTOM PROCEDURE TRAY) ×2 IMPLANT
KIT POSITION SURG JACKSON T1 (MISCELLANEOUS) ×2 IMPLANT
KIT TURNOVER KIT B (KITS) ×2 IMPLANT
MARKER SKIN DUAL TIP RULER LAB (MISCELLANEOUS) ×2 IMPLANT
NEEDLE 18GX1X1/2 (RX/OR ONLY) (NEEDLE) ×2 IMPLANT
NEEDLE 22X1 1/2 (OR ONLY) (NEEDLE) ×2 IMPLANT
NEEDLE HYPO 25GX1X1/2 BEV (NEEDLE) ×2 IMPLANT
NEEDLE SPNL 18GX3.5 QUINCKE PK (NEEDLE) ×4 IMPLANT
NS IRRIG 1000ML POUR BTL (IV SOLUTION) ×2 IMPLANT
OIL CARTRIDGE MAESTRO DRILL (MISCELLANEOUS) ×2
PACK LAMINECTOMY ORTHO (CUSTOM PROCEDURE TRAY) ×2 IMPLANT
PACK UNIVERSAL I (CUSTOM PROCEDURE TRAY) ×2 IMPLANT
PAD ARMBOARD 7.5X6 YLW CONV (MISCELLANEOUS) ×4 IMPLANT
PATTIES SURGICAL .5 X.5 (GAUZE/BANDAGES/DRESSINGS) ×2 IMPLANT
PATTIES SURGICAL .5 X1 (DISPOSABLE) ×2 IMPLANT
SLEEVE SCD COMPRESS KNEE MED (STOCKING) ×2 IMPLANT
SPONGE INTESTINAL PEANUT (DISPOSABLE) ×2 IMPLANT
SPONGE SURGIFOAM ABS GEL SZ50 (HEMOSTASIS) ×2 IMPLANT
STRIP CLOSURE SKIN 1/2X4 (GAUZE/BANDAGES/DRESSINGS) ×2 IMPLANT
SURGIFLO W/THROMBIN 8M KIT (HEMOSTASIS) ×2 IMPLANT
SUT MNCRL AB 4-0 PS2 18 (SUTURE) ×2 IMPLANT
SUT VIC AB 0 CT1 18XCR BRD 8 (SUTURE) ×1 IMPLANT
SUT VIC AB 0 CT1 8-18 (SUTURE) ×2
SUT VIC AB 1 CT1 18XCR BRD 8 (SUTURE) ×1 IMPLANT
SUT VIC AB 1 CT1 8-18 (SUTURE) ×2
SUT VIC AB 2-0 CT2 18 VCP726D (SUTURE) ×2 IMPLANT
SYR 20ML LL LF (SYRINGE) ×2 IMPLANT
SYR BULB IRRIG 60ML STRL (SYRINGE) ×2 IMPLANT
SYR CONTROL 10ML LL (SYRINGE) ×4 IMPLANT
SYR TB 1ML 25GX5/8 (SYRINGE) ×4 IMPLANT
SYR TB 1ML LUER SLIP (SYRINGE) ×4 IMPLANT
TAPE CLOTH SURG 4X10 WHT LF (GAUZE/BANDAGES/DRESSINGS) ×2 IMPLANT
TOWEL GREEN STERILE (TOWEL DISPOSABLE) ×2 IMPLANT
TOWEL GREEN STERILE FF (TOWEL DISPOSABLE) ×2 IMPLANT
WATER STERILE IRR 1000ML POUR (IV SOLUTION) ×2 IMPLANT
YANKAUER SUCT BULB TIP NO VENT (SUCTIONS) ×2 IMPLANT

## 2021-04-29 NOTE — Anesthesia Procedure Notes (Signed)
Procedure Name: Intubation Date/Time: 04/29/2021 2:26 PM Performed by: Moshe Salisbury, CRNA Pre-anesthesia Checklist: Patient identified, Emergency Drugs available, Suction available and Patient being monitored Patient Re-evaluated:Patient Re-evaluated prior to induction Oxygen Delivery Method: Circle System Utilized Preoxygenation: Pre-oxygenation with 100% oxygen Induction Type: IV induction Ventilation: Mask ventilation without difficulty Laryngoscope Size: Mac and 3 Grade View: Grade I Tube type: Oral Tube size: 7.5 mm Number of attempts: 1 Airway Equipment and Method: Stylet Placement Confirmation: ETT inserted through vocal cords under direct vision, positive ETCO2 and breath sounds checked- equal and bilateral Secured at: 20 cm Tube secured with: Tape Dental Injury: Teeth and Oropharynx as per pre-operative assessment

## 2021-04-29 NOTE — Anesthesia Postprocedure Evaluation (Signed)
Anesthesia Post Note  Patient: Erika Farley  Procedure(s) Performed: LEFT-SIDED LUMBAR 5 - SACRUM 1 MICRODISECTOMY (Left)     Patient location during evaluation: PACU Anesthesia Type: General Level of consciousness: sedated Pain management: pain level controlled Vital Signs Assessment: post-procedure vital signs reviewed and stable Respiratory status: spontaneous breathing and respiratory function stable Cardiovascular status: stable Postop Assessment: no apparent nausea or vomiting Anesthetic complications: no   No notable events documented.  Last Vitals:  Vitals:   04/29/21 1627 04/29/21 1641  BP: 120/76 114/73  Pulse: 81 78  Resp: 13 17  Temp: (!) 36.1 C   SpO2: 100% 99%    Last Pain:  Vitals:   04/29/21 1627  TempSrc:   PainSc: 0-No pain                 Pura Picinich DANIEL

## 2021-04-29 NOTE — H&P (Signed)
     PREOPERATIVE H&P  Chief Complaint: Left leg pain  HPI: Erika Farley is a 45 y.o. female who presents with ongoing pain in the left leg  MRI reveals a central/left L5/S1 HNP, resulting in left S1 radiculopathy.   Patient has failed multiple forms of conservative care and continues to have pain (see office notes for additional details regarding the patient's full course of treatment)  Past Medical History:  Diagnosis Date   Anxiety    Depression    Endometriosis    Infertility, female    malignant hyperthermia    mother   Migraine without aura    PCOS (polycystic ovarian syndrome)    Urinary incontinence    Past Surgical History:  Procedure Laterality Date   Bone tumor in finger     BREAST SURGERY     CRYOTHERAPY     HYSTEROSCOPY     LAPAROSCOPIC ENDOMETRIOSIS FULGURATION     PELVIC LAPAROSCOPY     Social History   Socioeconomic History   Marital status: Married    Spouse name: Not on file   Number of children: Not on file   Years of education: Not on file   Highest education level: Not on file  Occupational History   Not on file  Tobacco Use   Smoking status: Never   Smokeless tobacco: Never  Substance and Sexual Activity   Alcohol use: No   Drug use: No   Sexual activity: Yes    Partners: Male    Birth control/protection: None    Comment: husband had vasectomy   Other Topics Concern   Not on file  Social History Narrative   Not on file   Social Determinants of Health   Financial Resource Strain: Not on file  Food Insecurity: Not on file  Transportation Needs: Not on file  Physical Activity: Not on file  Stress: Not on file  Social Connections: Not on file   Family History  Problem Relation Age of Onset   Malignant hyperthermia Mother    Diabetes Mother    Malignant hyperthermia Maternal Aunt    Allergies  Allergen Reactions   Other Other (See Comments)    malignant hyperthermia   Prior to Admission medications   Medication Sig  Start Date End Date Taking? Authorizing Provider  buPROPion (WELLBUTRIN XL) 300 MG 24 hr tablet Take 1 tablet (300 mg total) by mouth daily. 10/21/16  Yes Romualdo Bolk, MD  sertraline (ZOLOFT) 50 MG tablet Take 50 mg by mouth daily.   Yes [provider]     All other systems have been reviewed and were otherwise negative with the exception of those mentioned in the HPI and as above.  Physical Exam: There were no vitals filed for this visit.  There is no height or weight on file to calculate BMI.  General: Alert, no acute distress Cardiovascular: No pedal edema Respiratory: No cyanosis, no use of accessory musculature Skin: No lesions in the area of chief complaint Neurologic: Sensation intact distally Psychiatric: Patient is competent for consent with normal mood and affect Lymphatic: No axillary or cervical lymphadenopathy   Assessment/Plan: ONGOING CHRONIC LEFT SACRUM 1 RADICULOPATHY Plan for Procedure(s): LEFT-SIDED LUMBAR 5 - SACRUM 1 MICRODISECTOMY   Jackelyn Hoehn, MD 04/29/2021 8:34 AM

## 2021-04-29 NOTE — Transfer of Care (Signed)
Immediate Anesthesia Transfer of Care Note  Patient: Erika Farley  Procedure(s) Performed: LEFT-SIDED LUMBAR 5 - SACRUM 1 MICRODISECTOMY (Left)  Patient Location: PACU  Anesthesia Type:General  Level of Consciousness: drowsy and patient cooperative  Airway & Oxygen Therapy: Patient Spontanous Breathing and Patient connected to nasal cannula oxygen  Post-op Assessment: Report given to RN, Post -op Vital signs reviewed and stable and Patient moving all extremities  Post vital signs: Reviewed and stable  Last Vitals:  Vitals Value Taken Time  BP 161/100 04/29/21 1626  Temp    Pulse 82 04/29/21 1629  Resp 16 04/29/21 1629  SpO2 100 % 04/29/21 1629  Vitals shown include unvalidated device data.  Last Pain:  Vitals:   04/29/21 1105  TempSrc:   PainSc: 3          Complications: No notable events documented.

## 2021-04-29 NOTE — Progress Notes (Signed)
Dumonski MD was notified of patient's two prescriptions needing to be changed to [facility] in [place]. Pharmacy was added to the patient's chart. MD stated he would reach out to his PA Kayla to call PACU and change the scripts. MD stated it was okay for the patient to go home and the scripts will be changed. Kayla PA called PACU and stated she would reach out to [facility] to work on changing the scripts. Patient was given information before discharge and is aware of the plan.

## 2021-04-29 NOTE — Op Note (Signed)
PATIENT NAME: Erika Farley   MEDICAL RECORD NO.:   [id number]    DATE OF BIRTH: 1976-05-20   DATE OF PROCEDURE: 04/29/2021                               OPERATIVE REPORT     PREOPERATIVE DIAGNOSES: 1. Left-sided S1 radiculopathy, chronic. 2. Left-sided L5-S1 disk herniation causing left S1 radiculopathy.   POSTOPERATIVE DIAGNOSES: 1. Left-sided S1 radiculopathy, chronic. 2. Left-sided L5-S1 disk herniation causing left S1 radiculopathy.   PROCEDURES:  Left-sided L5-S1 laminotomy with partial facetectomy and removal of herniated left-sided L5-S1 disk fragment.   SURGEON:  Estill Bamberg, MD.   ASSISTANTJason Coop, PA-C.   ANESTHESIA:  General endotracheal anesthesia.   COMPLICATIONS:  None.   DISPOSITION:  Stable.   ESTIMATED BLOOD LOSS:  Minimal.   INDICATIONS FOR SURGERY:  Briefly, [name] is a pleasant 45 year old female, who did present to me with ongoing severe pain in the left leg.  The patient's MRI did reveal the findings outlined above,  notable for a central/left herniated disk fragment. The pain was ongoing and rather severe.  We did discuss treatment options and we did ultimately elect to proceed with the procedure reflected above.  The patient was fully made aware of the risks of surgery, including the risk of recurrent herniation and the need for subsequent surgery, including the possibility of a subsequent diskectomy and/or fusion.   OPERATIVE DETAILS:  On 04/29/2021, the patient was brought to surgery and general endotracheal anesthesia was administered.  The patient was placed prone on a well-padded flat Jackson bed with a spinal frame.  Antibiotics were given.  The back was prepped and draped and a time-out procedure was performed.  At this point, a midline incision was made directly over the L5-S1 intervertebral space.  A curvilinear incision was made just to the left of the midline into the fascia.  A self-retaining McCulloch retractor  was placed.  The lamina of L5 and S1 was identified and subperiosteally exposed.  I then removed the lateral aspect of the L5-S1 ligamentum flavum.  Readily identified was the traversing left S1 nerve, which was noted to be under tension, and was noted to be rather erythematous.  I was able to gently gain medial retraction of the nerve, and in doing so, a prominent protrusion of the annulus was readily noted.  At this point, a 15 blade knife was used to perform an annulotomy. The annular defect was explored with a nerve hook, and multiple disc fragments were removed using straight and up-biting micro pituitary rongeurs.  1 fragment in particular was noted to be rather large. Ultimately, I was very pleased with the final decompression that I was able to accomplish.  At this point, the wound was copiously irrigated with normal saline.  All epidural bleeding was controlled using bipolar electrocautery in addition to Surgiflo. All bleeding was controlled at the termination of the procedure.  At this point, 20 mg of Depo-Medrol was introduced about the epidural space in the region of the left S1 nerve.  The wound was then closed in layers using #1 Vicryl followed by 0 Vicryl, followed by 4-0 Monocryl. Benzoin and Steri-Strips were applied followed by a sterile dressing. All instrument counts were correct at the termination of the procedure.   Of note, Jason Coop was my assistant throughout surgery, and did aid in retraction, suctioning, and closure from start to finish.  Estill Bamberg, MD

## 2021-04-30 ENCOUNTER — Encounter (HOSPITAL_COMMUNITY): Payer: Self-pay | Admitting: Orthopedic Surgery

## 2021-04-30 MED FILL — Thrombin (Recombinant) For Soln 20000 Unit: CUTANEOUS | Qty: 1 | Status: AC

## 2021-05-07 IMAGING — CR DG LUMBAR SPINE 2-3V
2 series · 2 of 2 positions shown · non-contrast
Comparison: [DATE]

CLINICAL DATA: L5-S1 microdiscectomy

EXAM:
LUMBAR SPINE - 2 VIEW

[lateral (1 of 2)]
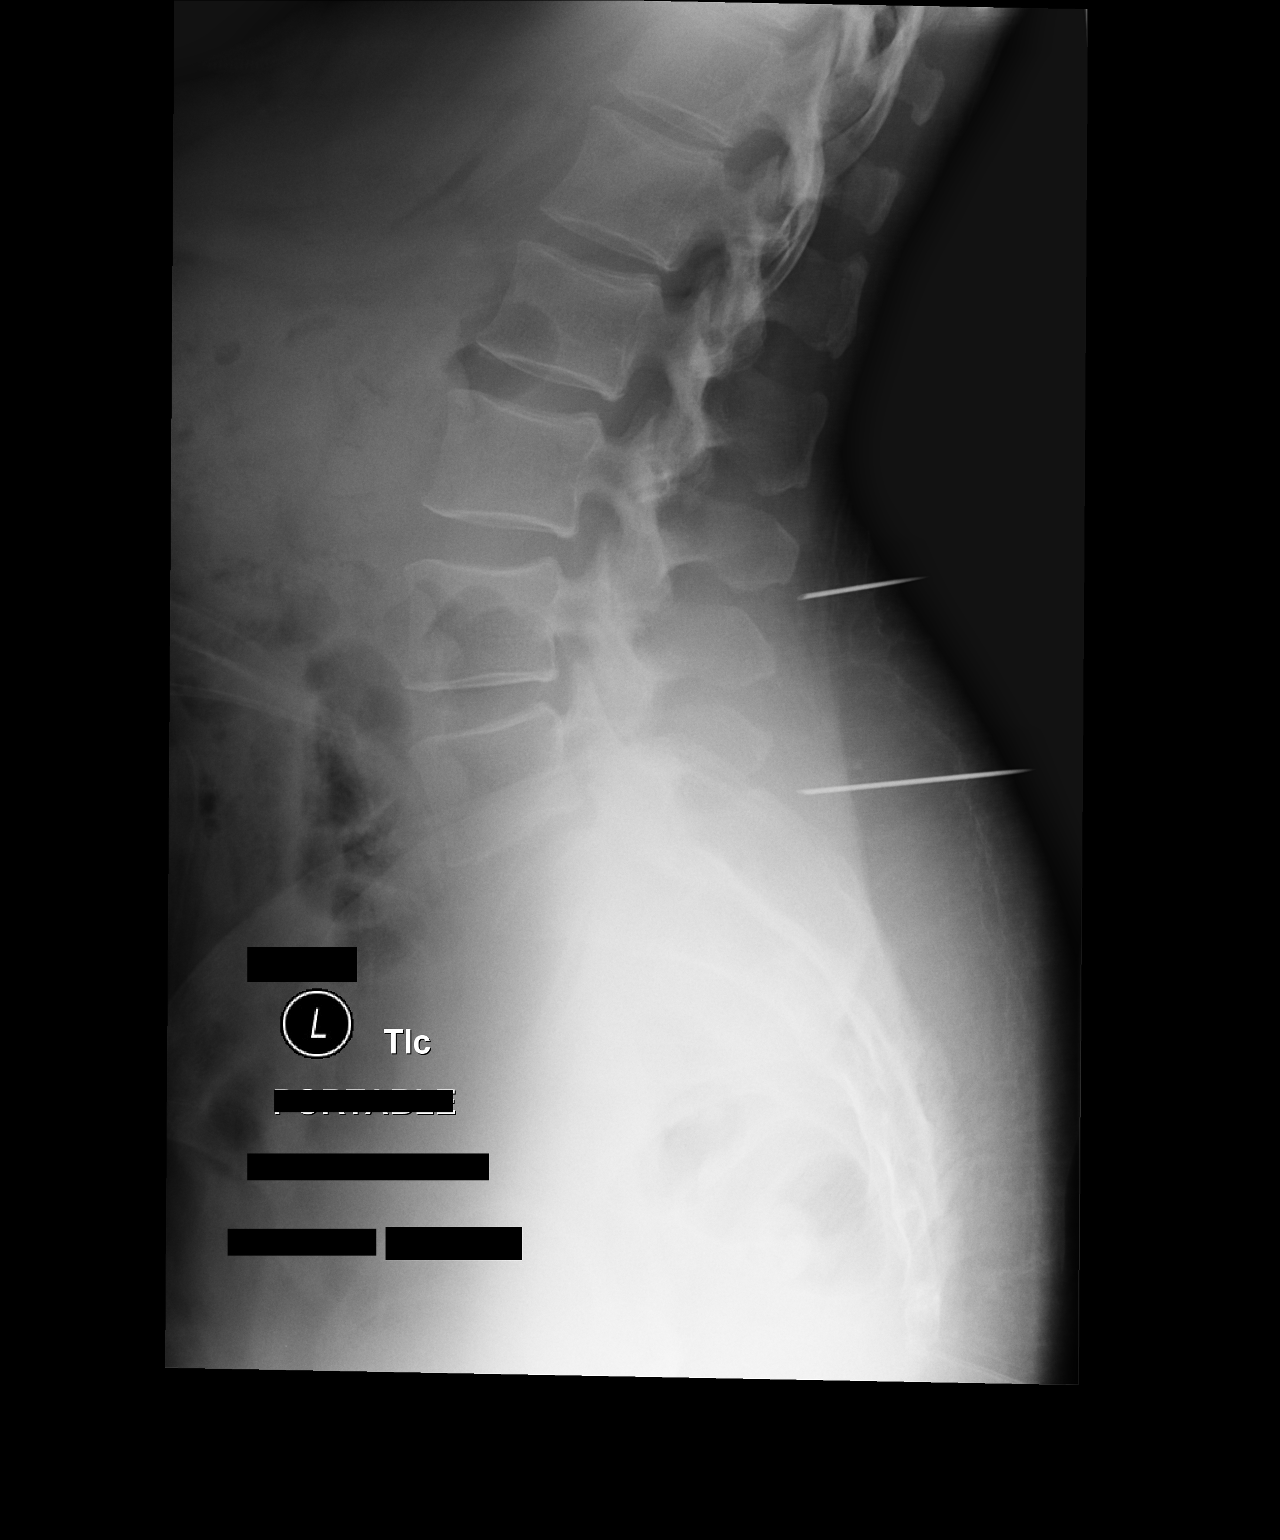

[lateral (2 of 2)]
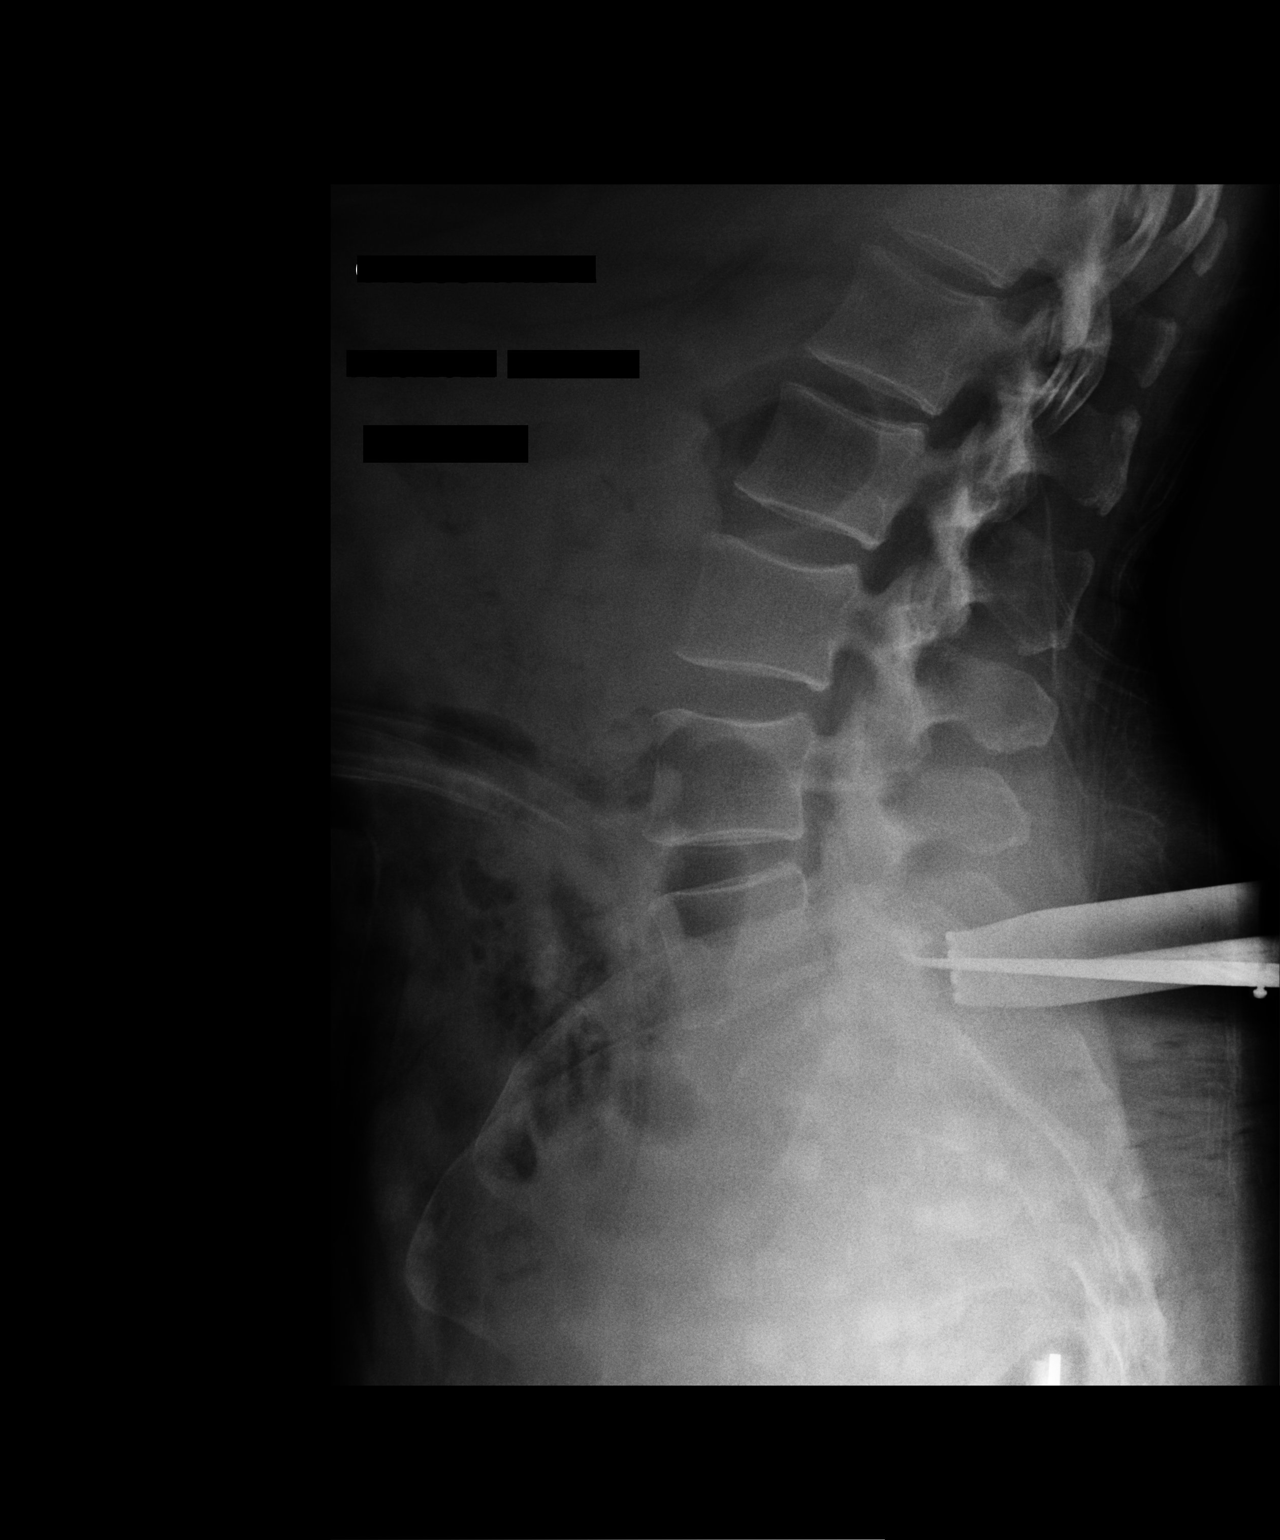

[2 of 2 positions shown; findings below may reference images not displayed]

FINDINGS: Initial images demonstrate needles in the posterior soft tissues at
the level of L4 as well as at the interspace at L5-S1. Subsequent
surgical instruments and retractors are noted at the L5-S1 level for
microdiscectomy.
IMPRESSION: Intraoperative localization at L5-S1.

## 2022-02-08 ENCOUNTER — Other Ambulatory Visit: Payer: Self-pay | Admitting: Orthopaedic Surgery

## 2022-02-08 DIAGNOSIS — M25512 Pain in left shoulder: Secondary | ICD-10-CM

## 2023-02-07 ENCOUNTER — Other Ambulatory Visit (HOSPITAL_BASED_OUTPATIENT_CLINIC_OR_DEPARTMENT_OTHER): Payer: Self-pay

## 2023-02-07 MED ORDER — LISDEXAMFETAMINE DIMESYLATE 60 MG PO CAPS
60.0000 mg | ORAL_CAPSULE | Freq: Every morning | ORAL | 0 refills | Status: DC
  Filled 2023-02-07: qty 30, 30d supply, fill #0

## 2023-03-11 ENCOUNTER — Other Ambulatory Visit: Payer: Self-pay

## 2023-03-11 ENCOUNTER — Other Ambulatory Visit (HOSPITAL_BASED_OUTPATIENT_CLINIC_OR_DEPARTMENT_OTHER): Payer: Self-pay

## 2023-03-11 MED ORDER — LISDEXAMFETAMINE DIMESYLATE 60 MG PO CAPS
60.0000 mg | ORAL_CAPSULE | Freq: Every morning | ORAL | 0 refills | Status: DC
  Filled 2023-03-11: qty 30, 30d supply, fill #0
